# Patient Record
Sex: Male | Born: 1960 | Race: Black or African American | Hispanic: No | Marital: Married | Smoking: Light tobacco smoker
Health system: Southern US, Community
[De-identification: ages and names within clinical notes are randomized; demographics above are authoritative.]

## PROBLEM LIST (undated history)

## (undated) DIAGNOSIS — M109 Gout, unspecified: Secondary | ICD-10-CM

## (undated) DIAGNOSIS — I10 Essential (primary) hypertension: Secondary | ICD-10-CM

## (undated) DIAGNOSIS — N189 Chronic kidney disease, unspecified: Secondary | ICD-10-CM

## (undated) HISTORY — PX: HERNIA REPAIR: SHX51

---

## 2015-09-11 ENCOUNTER — Inpatient Hospital Stay
Admission: EM | Admit: 2015-09-11 | Discharge: 2015-09-17 | DRG: 690 | Disposition: A | Discharge status: Home / Self Care | Payer: BLUE CROSS/BLUE SHIELD | Attending: Internal Medicine | Admitting: Internal Medicine

## 2015-09-11 ENCOUNTER — Inpatient Hospital Stay: Payer: BLUE CROSS/BLUE SHIELD

## 2015-09-11 ENCOUNTER — Encounter: Payer: Self-pay | Admitting: Internal Medicine

## 2015-09-11 DIAGNOSIS — N2589 Other disorders resulting from impaired renal tubular function: Secondary | ICD-10-CM | POA: Diagnosis present

## 2015-09-11 DIAGNOSIS — Z87442 Personal history of urinary calculi: Secondary | ICD-10-CM | POA: Diagnosis not present

## 2015-09-11 DIAGNOSIS — N390 Urinary tract infection, site not specified: Secondary | ICD-10-CM | POA: Diagnosis present

## 2015-09-11 DIAGNOSIS — N5089 Other specified disorders of the male genital organs: Secondary | ICD-10-CM | POA: Diagnosis not present

## 2015-09-11 DIAGNOSIS — Z8249 Family history of ischemic heart disease and other diseases of the circulatory system: Secondary | ICD-10-CM

## 2015-09-11 DIAGNOSIS — N453 Epididymo-orchitis: Secondary | ICD-10-CM | POA: Diagnosis not present

## 2015-09-11 DIAGNOSIS — N50819 Testicular pain, unspecified: Secondary | ICD-10-CM | POA: Diagnosis not present

## 2015-09-11 DIAGNOSIS — E872 Acidosis: Secondary | ICD-10-CM | POA: Diagnosis present

## 2015-09-11 DIAGNOSIS — N184 Chronic kidney disease, stage 4 (severe): Secondary | ICD-10-CM | POA: Diagnosis present

## 2015-09-11 DIAGNOSIS — N136 Pyonephrosis: Secondary | ICD-10-CM | POA: Diagnosis present

## 2015-09-11 DIAGNOSIS — F17200 Nicotine dependence, unspecified, uncomplicated: Secondary | ICD-10-CM | POA: Diagnosis present

## 2015-09-11 DIAGNOSIS — I129 Hypertensive chronic kidney disease with stage 1 through stage 4 chronic kidney disease, or unspecified chronic kidney disease: Secondary | ICD-10-CM | POA: Diagnosis present

## 2015-09-11 DIAGNOSIS — N451 Epididymitis: Secondary | ICD-10-CM | POA: Diagnosis not present

## 2015-09-11 DIAGNOSIS — M109 Gout, unspecified: Secondary | ICD-10-CM | POA: Diagnosis present

## 2015-09-11 DIAGNOSIS — N1330 Unspecified hydronephrosis: Secondary | ICD-10-CM | POA: Diagnosis not present

## 2015-09-11 DIAGNOSIS — N289 Disorder of kidney and ureter, unspecified: Secondary | ICD-10-CM

## 2015-09-11 DIAGNOSIS — N19 Unspecified kidney failure: Secondary | ICD-10-CM

## 2015-09-11 DIAGNOSIS — N135 Crossing vessel and stricture of ureter without hydronephrosis: Secondary | ICD-10-CM | POA: Diagnosis present

## 2015-09-11 DIAGNOSIS — I10 Essential (primary) hypertension: Secondary | ICD-10-CM | POA: Diagnosis present

## 2015-09-11 DIAGNOSIS — N179 Acute kidney failure, unspecified: Secondary | ICD-10-CM | POA: Diagnosis present

## 2015-09-11 DIAGNOSIS — N5082 Scrotal pain: Secondary | ICD-10-CM

## 2015-09-11 DIAGNOSIS — E875 Hyperkalemia: Secondary | ICD-10-CM | POA: Diagnosis present

## 2015-09-11 HISTORY — DX: Gout, unspecified: M10.9

## 2015-09-11 HISTORY — DX: Chronic kidney disease, unspecified: N18.9

## 2015-09-11 HISTORY — DX: Essential (primary) hypertension: I10

## 2015-09-11 LAB — CBC
HEMATOCRIT: 33.1 % — AB (ref 40.0–52.0)
HEMOGLOBIN: 11.4 g/dL — AB (ref 13.0–18.0)
MCH: 32.5 pg (ref 26.0–34.0)
MCHC: 34.4 g/dL (ref 32.0–36.0)
MCV: 94.4 fL (ref 80.0–100.0)
Platelets: 167 10*3/uL (ref 150–440)
RBC: 3.5 MIL/uL — ABNORMAL LOW (ref 4.40–5.90)
RDW: 12.7 % (ref 11.5–14.5)
WBC: 10.2 10*3/uL (ref 3.8–10.6)

## 2015-09-11 LAB — LIPASE, BLOOD: LIPASE: 57 U/L — AB (ref 11–51)

## 2015-09-11 LAB — BASIC METABOLIC PANEL
Anion gap: 13 (ref 5–15)
BUN: 97 mg/dL — AB (ref 6–20)
CHLORIDE: 104 mmol/L (ref 101–111)
CO2: 16 mmol/L — ABNORMAL LOW (ref 22–32)
Calcium: 9.4 mg/dL (ref 8.9–10.3)
Creatinine, Ser: 6.33 mg/dL — ABNORMAL HIGH (ref 0.61–1.24)
GFR calc Af Amer: 10 mL/min — ABNORMAL LOW (ref >60)
GFR calc non Af Amer: 9 mL/min — ABNORMAL LOW (ref >60)
GLUCOSE: 150 mg/dL — AB (ref 65–99)
POTASSIUM: 5.2 mmol/L — AB (ref 3.5–5.1)
Sodium: 133 mmol/L — ABNORMAL LOW (ref 135–145)

## 2015-09-11 LAB — COMPREHENSIVE METABOLIC PANEL
ALBUMIN: 2.8 g/dL — AB (ref 3.5–5.0)
ALK PHOS: 81 U/L (ref 38–126)
ALT: 38 U/L (ref 17–63)
ANION GAP: 14 (ref 5–15)
AST: 27 U/L (ref 15–41)
BILIRUBIN TOTAL: 1.3 mg/dL — AB (ref 0.3–1.2)
BUN: 104 mg/dL — ABNORMAL HIGH (ref 6–20)
CALCIUM: 9.6 mg/dL (ref 8.9–10.3)
CO2: 14 mmol/L — ABNORMAL LOW (ref 22–32)
Chloride: 104 mmol/L (ref 101–111)
Creatinine, Ser: 6.9 mg/dL — ABNORMAL HIGH (ref 0.61–1.24)
GFR calc Af Amer: 9 mL/min — ABNORMAL LOW (ref >60)
GFR, EST NON AFRICAN AMERICAN: 8 mL/min — AB (ref >60)
GLUCOSE: 100 mg/dL — AB (ref 65–99)
Potassium: 5.5 mmol/L — ABNORMAL HIGH (ref 3.5–5.1)
Sodium: 132 mmol/L — ABNORMAL LOW (ref 135–145)
TOTAL PROTEIN: 7.3 g/dL (ref 6.5–8.1)

## 2015-09-11 LAB — URINALYSIS COMPLETE WITH MICROSCOPIC (ARMC ONLY)
Bilirubin Urine: NEGATIVE
Glucose, UA: NEGATIVE mg/dL
Ketones, ur: NEGATIVE mg/dL
Nitrite: NEGATIVE
PH: 5 (ref 5.0–8.0)
PROTEIN: 30 mg/dL — AB
Specific Gravity, Urine: 1.01 (ref 1.005–1.030)

## 2015-09-11 LAB — SODIUM, URINE, RANDOM: Sodium, Ur: 26 mmol/L

## 2015-09-11 LAB — TROPONIN I: Troponin I: <0.03 ng/mL (ref <0.03)

## 2015-09-11 LAB — CREATININE, URINE, RANDOM: Creatinine, Urine: 143 mg/dL

## 2015-09-11 LAB — URIC ACID: URIC ACID, SERUM: 15.1 mg/dL — AB (ref 4.4–7.6)

## 2015-09-11 MED ORDER — DOCUSATE SODIUM 100 MG PO CAPS
100.0000 mg | ORAL_CAPSULE | Freq: Two times a day (BID) | ORAL | Status: DC
  Administered 2015-09-11 – 2015-09-14 (×5): 100 mg via ORAL
  Filled 2015-09-11 (×6): qty 1

## 2015-09-11 MED ORDER — HYDRALAZINE HCL 25 MG PO TABS
25.0000 mg | ORAL_TABLET | Freq: Three times a day (TID) | ORAL | Status: DC
  Administered 2015-09-11: 25 mg via ORAL
  Filled 2015-09-11 (×2): qty 1

## 2015-09-11 MED ORDER — ACETAMINOPHEN 650 MG RE SUPP: 650.0000 mg | Freq: Four times a day (QID) | RECTAL | Status: DC | PRN

## 2015-09-11 MED ORDER — SODIUM CHLORIDE 0.9 % IV SOLN
INTRAVENOUS | Status: DC
  Administered 2015-09-11 – 2015-09-15 (×11): via INTRAVENOUS

## 2015-09-11 MED ORDER — METOPROLOL TARTRATE 50 MG PO TABS
50.0000 mg | ORAL_TABLET | Freq: Two times a day (BID) | ORAL | Status: DC
  Administered 2015-09-11 – 2015-09-17 (×12): 50 mg via ORAL
  Filled 2015-09-11 (×12): qty 1

## 2015-09-11 MED ORDER — SODIUM POLYSTYRENE SULFONATE 15 GM/60ML PO SUSP
30.0000 g | Freq: Once | ORAL | Status: AC
  Administered 2015-09-11: 30 g via ORAL
  Filled 2015-09-11: qty 120

## 2015-09-11 MED ORDER — HEPARIN SODIUM (PORCINE) 5000 UNIT/ML IJ SOLN
5000.0000 [IU] | Freq: Three times a day (TID) | INTRAMUSCULAR | Status: DC
  Administered 2015-09-11 – 2015-09-17 (×16): 5000 [IU] via SUBCUTANEOUS
  Filled 2015-09-11 (×16): qty 1

## 2015-09-11 MED ORDER — ACETAMINOPHEN 325 MG PO TABS: 650.0000 mg | ORAL_TABLET | Freq: Four times a day (QID) | ORAL | Status: DC | PRN

## 2015-09-11 MED ORDER — ONDANSETRON HCL 4 MG/2ML IJ SOLN
4.0000 mg | Freq: Four times a day (QID) | INTRAMUSCULAR | Status: DC | PRN
  Administered 2015-09-11: 4 mg via INTRAVENOUS
  Filled 2015-09-11: qty 2

## 2015-09-11 MED ORDER — ONDANSETRON HCL 4 MG/2ML IJ SOLN
4.0000 mg | Freq: Once | INTRAMUSCULAR | Status: AC
  Administered 2015-09-11: 4 mg via INTRAVENOUS
  Filled 2015-09-11: qty 2

## 2015-09-11 MED ORDER — DEXTROSE 5 % IV SOLN
1.0000 g | Freq: Once | INTRAVENOUS | Status: AC
  Administered 2015-09-11: 1 g via INTRAVENOUS
  Filled 2015-09-11: qty 10

## 2015-09-11 MED ORDER — ONDANSETRON HCL 4 MG PO TABS: 4.0000 mg | ORAL_TABLET | Freq: Four times a day (QID) | ORAL | Status: DC | PRN

## 2015-09-11 MED ORDER — MORPHINE SULFATE (PF) 4 MG/ML IV SOLN
4.0000 mg | Freq: Once | INTRAVENOUS | Status: AC
  Administered 2015-09-11: 4 mg via INTRAVENOUS
  Filled 2015-09-11: qty 1

## 2015-09-11 MED ORDER — DEXTROSE 5 % IV SOLN
1.0000 g | INTRAVENOUS | Status: DC
  Administered 2015-09-12 – 2015-09-14 (×3): 1 g via INTRAVENOUS
  Filled 2015-09-11 (×3): qty 10

## 2015-09-11 MED ORDER — OXYCODONE HCL 5 MG PO TABS
5.0000 mg | ORAL_TABLET | ORAL | Status: DC | PRN
  Administered 2015-09-11 – 2015-09-17 (×10): 5 mg via ORAL
  Filled 2015-09-11 (×12): qty 1

## 2015-09-11 MED ORDER — MORPHINE SULFATE (PF) 2 MG/ML IV SOLN
2.0000 mg | INTRAVENOUS | Status: DC | PRN
  Administered 2015-09-11 – 2015-09-16 (×9): 2 mg via INTRAVENOUS
  Filled 2015-09-11 (×9): qty 1

## 2015-09-11 MED ORDER — SODIUM CHLORIDE 0.9% FLUSH
3.0000 mL | Freq: Two times a day (BID) | INTRAVENOUS | Status: DC
  Administered 2015-09-11 – 2015-09-16 (×9): 3 mL via INTRAVENOUS

## 2015-09-11 NOTE — ED Provider Notes (Signed)
[facility] Emergency Department Provider Note  Time seen: 9:33 AM  I have reviewed the triage vital signs and the nursing notes.   HISTORY  Chief Complaint Weakness    HPI Shiquan Mathieu is a 55 y.o. male with a past medical history of gout who presents to the emergency department multiple complaints including knee pain, shoulder pain, abdominal pain and kidney dysfunction. According to the patient he has chronic kidney disease, feels like he has a "kidney infection." Patient denies any dysuria or fever. Does state some nausea but denies vomiting or diarrhea. States he's having some back pain. He states his main complaint however is pain in both of his knees and shoulders which she states is consistent with gout. He states the reason he came into the emergency department today was because he could not sleep last night due to his joint pain.Patient also states diffuse generalized weakness. Denies chest pain.     No past medical history on file.  There are no active problems to display for this patient.   No past surgical history on file.  No current outpatient prescriptions on file.  Allergies Review of patient's allergies indicates not on file.  No family history on file.  Social History Social History  Substance Use Topics  . Smoking status: Not on file  . Smokeless tobacco: Not on file  . Alcohol Use: Not on file    Review of Systems Constitutional: Negative for fever.Positive for generalized weakness. Cardiovascular: Negative for chest pain. Respiratory: Negative for shortness of breath. Gastrointestinal: Mild diffuse abdominal pain. Positive for nausea. Negative for vomiting or diarrhea. Genitourinary: Negative for dysuria. Positive for back pain consistent with "kidney pain" Musculoskeletal: Moderate mid to lower back pain Neurological: Negative for headache 10-point ROS otherwise  negative.  ____________________________________________   PHYSICAL EXAM:  Constitutional: Alert and oriented. Well appearing and in no distress. Eyes: Normal exam ENT   Head: Normocephalic and atraumatic.   Mouth/Throat: Mucous membranes are moist. Cardiovascular: Normal rate, regular rhythm. No murmur Respiratory: Normal respiratory effort without tachypnea nor retractions. Breath sounds are clear  Gastrointestinal: Soft, mild diffuse abdominal tenderness palpation, no rebound or guarding, no distention. Musculoskeletal: Nontender with normal range of motion in all extremities. Neurologic:  Normal speech and language. No gross focal neurologic deficits Skin:  Skin is warm, dry and intact.  Psychiatric: Mood and affect are normal.  ____________________________________________    EKG  EKG reviewed and interpreted by myself shows sinus rhythm at 98 bpm narrow QRS, normal axis, largely normal intervals, nonspecific ST changes without ST elevation.  ____________________________________________   INITIAL IMPRESSION / ASSESSMENT AND PLAN / ED COURSE  Pertinent labs & imaging results that were available during my care of the patient were reviewed by me and considered in my medical decision making (see chart for details).  The patient presents the emergency department with multiple complaints including generalized weakness, kidney pain, possible kidney infection, and joint pain, gout pain. Patient's biggest complaint he states is the pain in his knees and shoulders. We will check labs, and closely monitor in the emergency department. Patient's initial blood pressure is low around 100 systolic with a heart rate in the mid 90s. We will IV hydrate while awaiting lab results.  Urinalysis consistent with urinary tract infection. Creatinine 6.9 consistent with acute renal insufficiency. We will admit the patient for further  treatment. ____________________________________________   FINAL CLINICAL IMPRESSION(S) / ED DIAGNOSES  Generalized weakness Joint pains Abdominal pain Urinary tract infection Acute renal  insufficiency  Minna AntisKevin Tyneka Scafidi, MD 09/11/15 1224

## 2015-09-11 NOTE — H&P (Signed)
[facility] - [place] at [facility]   PATIENT NAME: Frederick Lee    MR#:  [id number]  DATE OF BIRTH:  11/19/1960   DATE OF ADMISSION:  09/11/2015  PRIMARY CARE PHYSICIAN: Pcp Not In System   REQUESTING/REFERRING PHYSICIAN: Paduchowski  CHIEF COMPLAINT:   Chief Complaint  Patient presents with  . Weakness    HISTORY OF PRESENT ILLNESS:  Frederick Lee  is a 55 y.o. male with a known history of Essential hypertension, chronic kidney disease unknown baseline possibly stage IV with  GFR of 21, gout who is presenting with generalized weakness and abdominal pain. He describes 3-4 day duration of abdominal pain suprapubic in location and painful in quality 7/10 in intensity no worsening or relieving factors. He also describes dysuria, subjective fevers chills, poor oral intake. 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  PAST MEDICAL HISTORY:   Past Medical History  Diagnosis Date  . Hypertension   . Gout   . Chronic kidney disease     PAST SURGICAL HISTORY:   Past Surgical History  Procedure Laterality Date  . Hernia repair      SOCIAL HISTORY:   Social History  Substance Use Topics  . Smoking status: Light Tobacco Smoker  . Smokeless tobacco: Not on file  . Alcohol Use: No    FAMILY HISTORY:   Family History  Problem Relation Age of Onset  . CAD Other     DRUG ALLERGIES:  No Known Allergies  REVIEW OF SYSTEMS:  REVIEW OF SYSTEMS:  CONSTITUTIONAL: Positive fevers, chills, fatigue, weakness.  EYES: Denies blurred vision, double vision, or eye pain.  EARS, NOSE, THROAT: Denies tinnitus, ear pain, hearing loss.  RESPIRATORY: denies cough, shortness of breath, wheezing  CARDIOVASCULAR: Denies chest pain, palpitations, edema.  GASTROINTESTINAL: Denies nausea, vomiting, diarrhea, positive abdominal pain.  GENITOURINARY: Positive dysuria, denies hematuria.    ENDOCRINE: Denies nocturia or thyroid problems. HEMATOLOGIC AND LYMPHATIC: Denies easy bruising or bleeding.  SKIN: Denies rash or lesions.  MUSCULOSKELETAL: Denies pain in neck, back, shoulder, knees, hips, or further arthritic symptoms.  NEUROLOGIC: Denies paralysis, paresthesias.  PSYCHIATRIC: Denies anxiety or depressive symptoms. Otherwise full review of systems performed by me is negative.   MEDICATIONS AT HOME:   Prior to Admission medications   Not on File      VITAL SIGNS:  Blood pressure 139/72, pulse 98, temperature 97.5 F (36.4 C), temperature source Oral, resp. rate 18, height  (1.981 m), weight 320 lb (145.151 kg), SpO2 99 %.  PHYSICAL EXAMINATION:  VITAL SIGNS: Filed Vitals:   09/11/15 1150 09/11/15 1217  BP: 133/69 139/72  Pulse:  98  Temp:    Resp: 20 18   GENERAL:54 y.o.male currently in Minimal acute distress. Ill appearing HEAD: Normocephalic, atraumatic.  EYES: Pupils equal, round, reactive to light. Extraocular muscles intact. No scleral icterus.  MOUTH: Moist mucosal membrane. Dentition intact. No abscess noted.  EAR, NOSE, THROAT: Clear without exudates. No external lesions.  NECK: Supple. No thyromegaly. No nodules. No JVD.  PULMONARY: Clear to ascultation, without wheeze rails or rhonci. No use of accessory muscles, Good respiratory effort. good air entry bilaterally CHEST: Nontender to palpation.  CARDIOVASCULAR: S1 and S2. Regular rate and rhythm. No murmurs, rubs, or gallops. No edema. Pedal pulses 2+ bilaterally.  GASTROINTESTINAL: Soft, suprapubic tenderness without rebound or guarding, nondistended. No masses. Positive bowel sounds. No hepatosplenomegaly.  MUSCULOSKELETAL: No swelling,  clubbing, or edema. Range of motion full in all extremities.  NEUROLOGIC: Cranial nerves II through XII are intact. No gross focal neurological deficits. Sensation intact. Reflexes intact.  SKIN: No ulceration, lesions, rashes, or cyanosis. Skin warm and  dry. Turgor intact.  PSYCHIATRIC: Mood, affect within normal limits. The patient is awake, alert and oriented x 3. Insight, judgment intact.    LABORATORY PANEL:   CBC  Recent Labs Lab 09/11/15 0937  WBC 10.2  HGB 11.4*  HCT 33.1*  PLT 167   ------------------------------------------------------------------------------------------------------------------  Chemistries   Recent Labs Lab 09/11/15 0937  NA 132*  K 5.5*  CL 104  CO2 14*  GLUCOSE 100*  BUN 104*  CREATININE 6.90*  CALCIUM 9.6  AST 27  ALT 38  ALKPHOS 81  BILITOT 1.3*   ------------------------------------------------------------------------------------------------------------------  Cardiac Enzymes  Recent Labs Lab 09/11/15 0937  TROPONINI <0.03   ------------------------------------------------------------------------------------------------------------------  RADIOLOGY:  No results found.  EKG:  No orders found for this or any previous visit.  IMPRESSION AND PLAN:   55 year old gentleman history of essential hypertension, CK D possibly stage IV presenting with abdominal pain and weakness  1. Acute renal failure: Uncertain baseline possibly stage IV, IV fluid hydration follow urine output and renal function check urine lites, renal ultrasound 2. Hyperkalemia: IV fluid hydration, Kayexalate, recheck BMP to follow potassium level III. Urinary tract infection site unspecified: Ceftriaxone, follow urine culture blood culture 4. Essential hypertension: Patient is somewhat uncertain of medications though states he thinks he is on metoprolol, lisinopril, hydrochlorothiazide plus one other medication regardless I would hold lisinopril hydrochlorothiazide for now if blood pressure elevates can restart low-dose metoprolol until figure out what his medications actually are, he states he has not taken them for at least 4 days anyways 5. Venous thromboembolism prophylactic: Heparin    All the records are  reviewed and case discussed with ED provider. Management plans discussed with the patient, family and they are in agreement.  CODE STATUS: Full  TOTAL TIME TAKING CARE OF THIS PATIENT: Critical 33 minutes.    Hower,  Mardi MainlandDavid K M.D on 09/11/2015 at 12:44 PM  Between 7am to 6pm - Pager - [phone]  After 6pm: House Pager: - [phone]  [facility] [place] [facility]  Office  [phone]  CC: Primary care physician; Pcp Not In System

## 2015-09-11 NOTE — ED Notes (Signed)
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

## 2015-09-11 NOTE — ED Notes (Signed)
Pt states that he has never felt this bad in his life,pt reports pain all over, mostly his gout pain in his legs, but states that he feels weak as water. Pt also states that about a week ago he started feeling like he maybe getting a uti because he was having side pain, pt states that he hasn't eaten since Tuesday and states he hasn't drank anything since Wednesday, pt states that he hasn't called his family, so this RN and charge RN assisted pt to do so on his cell phone

## 2015-09-12 ENCOUNTER — Encounter
Admission: EM | Disposition: A | Discharge status: Home / Self Care | Payer: Self-pay | Source: Home / Self Care | Attending: Internal Medicine

## 2015-09-12 ENCOUNTER — Inpatient Hospital Stay: Payer: BLUE CROSS/BLUE SHIELD

## 2015-09-12 ENCOUNTER — Inpatient Hospital Stay: Payer: BLUE CROSS/BLUE SHIELD | Admitting: Anesthesiology

## 2015-09-12 ENCOUNTER — Encounter: Payer: Self-pay | Admitting: Anesthesiology

## 2015-09-12 DIAGNOSIS — N179 Acute kidney failure, unspecified: Secondary | ICD-10-CM | POA: Diagnosis not present

## 2015-09-12 DIAGNOSIS — N1330 Unspecified hydronephrosis: Secondary | ICD-10-CM

## 2015-09-12 DIAGNOSIS — N5082 Scrotal pain: Secondary | ICD-10-CM

## 2015-09-12 DIAGNOSIS — N390 Urinary tract infection, site not specified: Secondary | ICD-10-CM

## 2015-09-12 DIAGNOSIS — N19 Unspecified kidney failure: Secondary | ICD-10-CM

## 2015-09-12 DIAGNOSIS — N289 Disorder of kidney and ureter, unspecified: Secondary | ICD-10-CM

## 2015-09-12 HISTORY — PX: CYSTOSCOPY WITH STENT PLACEMENT: SHX5790

## 2015-09-12 LAB — CBC
HEMATOCRIT: 28.8 % — AB (ref 40.0–52.0)
Hemoglobin: 9.9 g/dL — ABNORMAL LOW (ref 13.0–18.0)
MCH: 32.5 pg (ref 26.0–34.0)
MCHC: 34.4 g/dL (ref 32.0–36.0)
MCV: 94.5 fL (ref 80.0–100.0)
Platelets: 154 10*3/uL (ref 150–440)
RBC: 3.05 MIL/uL — AB (ref 4.40–5.90)
RDW: 13 % (ref 11.5–14.5)
WBC: 9 10*3/uL (ref 3.8–10.6)

## 2015-09-12 LAB — BASIC METABOLIC PANEL
Anion gap: 9 (ref 5–15)
BUN: 99 mg/dL — AB (ref 6–20)
CHLORIDE: 110 mmol/L (ref 101–111)
CO2: 18 mmol/L — AB (ref 22–32)
CREATININE: 5.98 mg/dL — AB (ref 0.61–1.24)
Calcium: 8.7 mg/dL — ABNORMAL LOW (ref 8.9–10.3)
GFR calc non Af Amer: 10 mL/min — ABNORMAL LOW (ref >60)
GFR, EST AFRICAN AMERICAN: 11 mL/min — AB (ref >60)
Glucose, Bld: 104 mg/dL — ABNORMAL HIGH (ref 65–99)
POTASSIUM: 5.4 mmol/L — AB (ref 3.5–5.1)
Sodium: 137 mmol/L (ref 135–145)

## 2015-09-12 LAB — URINE CULTURE

## 2015-09-12 LAB — MRSA PCR SCREENING: MRSA by PCR: NEGATIVE

## 2015-09-12 SURGERY — CYSTOSCOPY, WITH STENT INSERTION
Anesthesia: General | Laterality: Left

## 2015-09-12 MED ORDER — FENTANYL CITRATE (PF) 100 MCG/2ML IJ SOLN
INTRAMUSCULAR | Status: DC | PRN
  Administered 2015-09-12: 25 ug via INTRAVENOUS
  Administered 2015-09-12: 50 ug via INTRAVENOUS
  Administered 2015-09-12 (×3): 25 ug via INTRAVENOUS

## 2015-09-12 MED ORDER — MIDAZOLAM HCL 2 MG/2ML IJ SOLN
INTRAMUSCULAR | Status: DC | PRN
  Administered 2015-09-12: 2 mg via INTRAVENOUS
  Administered 2015-09-12: 3 mg via INTRAVENOUS

## 2015-09-12 MED ORDER — PROPOFOL 10 MG/ML IV BOLUS
INTRAVENOUS | Status: DC | PRN
  Administered 2015-09-12 (×2): 50 mg via INTRAVENOUS

## 2015-09-12 MED ORDER — FENTANYL CITRATE (PF) 100 MCG/2ML IJ SOLN
25.0000 ug | INTRAMUSCULAR | Status: DC | PRN
Start: 2015-09-12 — End: 2015-09-12

## 2015-09-12 MED ORDER — PREDNISONE 20 MG PO TABS
40.0000 mg | ORAL_TABLET | Freq: Every day | ORAL | Status: DC
  Administered 2015-09-12 – 2015-09-15 (×4): 40 mg via ORAL
  Filled 2015-09-12 (×4): qty 2

## 2015-09-12 MED ORDER — PROPOFOL 500 MG/50ML IV EMUL
INTRAVENOUS | Status: DC | PRN
  Administered 2015-09-12: 100 ug/kg/min via INTRAVENOUS

## 2015-09-12 MED ORDER — ONDANSETRON HCL 4 MG/2ML IJ SOLN
INTRAMUSCULAR | Status: DC | PRN
Start: 2015-09-12 — End: 2015-09-12
  Administered 2015-09-12: 4 mg via INTRAVENOUS

## 2015-09-12 MED ORDER — IOTHALAMATE MEGLUMINE 43 % IV SOLN
INTRAVENOUS | Status: DC | PRN
  Administered 2015-09-12: 10 mL via URETHRAL

## 2015-09-12 MED ORDER — PROMETHAZINE HCL 25 MG/ML IJ SOLN: 6.2500 mg | INTRAMUSCULAR | Status: DC | PRN

## 2015-09-12 SURGICAL SUPPLY — 23 items
BAG DRAIN CYSTO-URO LG1000N (MISCELLANEOUS) ×2 IMPLANT
CATH FOL 2WAY LX 16X5 (CATHETERS) IMPLANT
CATH FOLEY 2WAY  5CC 16FR (CATHETERS) ×1
CATH URETL 5X70 OPEN END (CATHETERS) ×2 IMPLANT
CATH URTH 16FR FL 2W BLN LF (CATHETERS) ×1 IMPLANT
CONRAY 43 FOR UROLOGY 50M (MISCELLANEOUS) ×2 IMPLANT
GLOVE BIO SURGEON STRL SZ 6.5 (GLOVE) ×6 IMPLANT
GOWN STRL REUS W/ TWL LRG LVL4 (GOWN DISPOSABLE) ×2 IMPLANT
GOWN STRL REUS W/TWL LRG LVL4 (GOWN DISPOSABLE) ×2
HOLDER FOLEY CATH W/STRAP (MISCELLANEOUS) ×2 IMPLANT
KIT RM TURNOVER CYSTO AR (KITS) ×2 IMPLANT
PACK CYSTO AR (MISCELLANEOUS) ×2 IMPLANT
SENSORWIRE 0.038 NOT ANGLED (WIRE) ×2
SET CYSTO W/LG BORE CLAMP LF (SET/KITS/TRAYS/PACK) ×2 IMPLANT
SOL .9 NS 3000ML IRR  AL (IV SOLUTION) ×1
SOL .9 NS 3000ML IRR UROMATIC (IV SOLUTION) ×1 IMPLANT
STENT URET 6FRX24 CONTOUR (STENTS) IMPLANT
STENT URET 6FRX26 CONTOUR (STENTS) IMPLANT
STENT URO INLAY 6FRX26CM (STENTS) ×2 IMPLANT
SURGILUBE 2OZ TUBE FLIPTOP (MISCELLANEOUS) ×2 IMPLANT
SYRINGE IRR TOOMEY STRL 70CC (SYRINGE) ×2 IMPLANT
WATER STERILE IRR 1000ML POUR (IV SOLUTION) ×2 IMPLANT
WIRE SENSOR 0.038 NOT ANGLED (WIRE) ×1 IMPLANT

## 2015-09-12 NOTE — Progress Notes (Signed)
[facility] - [place] at [facility]   PATIENT NAME: Frederick Lee    MRN#:  [id number]  DATE OF BIRTH:  08/18/1960  SUBJECTIVE:  Hospital Day: 1 day Frederick Lee is a 55 y.o. male presenting with Weakness .   Overnight events: No acute overnight events Interval Events: New complaints of bilateral knee pain right hand pain patient states "my gout is acting up"  REVIEW OF SYSTEMS:  CONSTITUTIONAL: No fever, fatigue or weakness.  EYES: No blurred or double vision.  EARS, NOSE, AND THROAT: No tinnitus or ear pain.  RESPIRATORY: No cough, shortness of breath, wheezing or hemoptysis.  CARDIOVASCULAR: No chest pain, orthopnea, edema.  GASTROINTESTINAL: No nausea, vomiting, diarrhea or abdominal pain.  GENITOURINARY: No dysuria, hematuria.  ENDOCRINE: No polyuria, nocturia,  HEMATOLOGY: No anemia, easy bruising or bleeding SKIN: No rash or lesion. MUSCULOSKELETAL: Positive joint pain as above.   NEUROLOGIC: No tingling, numbness, weakness.  PSYCHIATRY: No anxiety or depression.   DRUG ALLERGIES:  No Known Allergies  VITALS:  Blood pressure 110/55, pulse 78, temperature 97.8 F (36.6 C), temperature source Oral, resp. rate 20, height  (1.981 m), weight 320 lb (145.151 kg), SpO2 98 %.  PHYSICAL EXAMINATION:  VITAL SIGNS: Filed Vitals:   09/12/15 0515 09/12/15 0942  BP: 116/57 110/55  Pulse: 78   Temp: 97.8 F (36.6 C)   Resp: 20    GENERAL:54 y.o.male currently in no acute distress.  HEAD: Normocephalic, atraumatic.  EYES: Pupils equal, round, reactive to light. Extraocular muscles intact. No scleral icterus.  MOUTH: Moist mucosal membrane. Dentition intact. No abscess noted.  EAR, NOSE, THROAT: Clear without exudates. No external lesions.  NECK: Supple. No thyromegaly. No nodules. No JVD.  PULMONARY: Clear to ascultation, without wheeze rails or rhonci. No use of accessory muscles, Good respiratory effort. good air entry bilaterally CHEST:  Nontender to palpation.  CARDIOVASCULAR: S1 and S2. Regular rate and rhythm. No murmurs, rubs, or gallops. No edema. Pedal pulses 2+ bilaterally.  GASTROINTESTINAL: Soft, nontender, nondistended. No masses. Positive bowel sounds. No hepatosplenomegaly.  MUSCULOSKELETAL: Bilateral knees and right 4-5 MCP with erythema and edema and to touch otherwise No swelling, clubbing, or edema. Range of motion full in all extremities.  NEUROLOGIC: Cranial nerves II through XII are intact. No gross focal neurological deficits. Sensation intact. Reflexes intact.  SKIN: No ulceration, lesions, rashes, or cyanosis. Skin warm and dry. Turgor intact.  PSYCHIATRIC: Mood, affect within normal limits. The patient is awake, alert and oriented x 3. Insight, judgment intact.      LABORATORY PANEL:   CBC  Recent Labs Lab 09/12/15 0346  WBC 9.0  HGB 9.9*  HCT 28.8*  PLT 154   ------------------------------------------------------------------------------------------------------------------  Chemistries   Recent Labs Lab 09/11/15 0937  09/12/15 0346  NA 132*  < > 137  K 5.5*  < > 5.4*  CL 104  < > 110  CO2 14*  < > 18*  GLUCOSE 100*  < > 104*  BUN 104*  < > 99*  CREATININE 6.90*  < > 5.98*  CALCIUM 9.6  < > 8.7*  AST 27  --   --   ALT 38  --   --   ALKPHOS 81  --   --   BILITOT 1.3*  --   --   < > = values in this interval not displayed. ------------------------------------------------------------------------------------------------------------------  Cardiac Enzymes  Recent Labs Lab 09/11/15 0937  TROPONINI <0.03   ------------------------------------------------------------------------------------------------------------------  RADIOLOGY:  US Renal  09/11/2015  CLINICAL DATA:  Acute renal injury EXAM: RENAL / URINARY TRACT ULTRASOUND COMPLETE COMPARISON:  None in PACs FINDINGS: Right Kidney: Length: 11.5 cm. The renal cortical echotexture is increased diffusely. There is no  hydronephrosis. There is no discrete mass. Left Kidney: Length: 14.3 cm. There is severe hydronephrosis. There is cortical thinning. The echotexture of the thinned cortex is increased similar to that on the right. Bladder: The urinary bladder is only partially distended. Ureteral jets are not observed. IMPRESSION: 1. Severe left-sided hydronephrosis. 2. Moderately increased renal cortical echotexture bilaterally. Significant cortical thinning on the left. 3. Only partial distention of the urinary bladder. Electronically Signed   By: David  SwazilandJordan M.D.   On: 09/11/2015 16:56    EKG:  No orders found for this or any previous visit.  ASSESSMENT AND PLAN:   Frederick Lee is a 55 y.o. male presenting with Weakness . Admitted 09/11/2015 : Day #: 1 day 1. Acute kidney injury on chronic kidney disease: Continue IV fluid hydration will consult nephrology given slow return to baseline follow urine output renal function 2. Hyperkalemia: Continue IV fluid hydration continue to monitor if elevates will need further intervention 3. Acute gout flare: Treatment options somewhat limited given kidney function will have pain medications and steroids 4. Urinary tract infection: Continue IV antibiotics follow culture data and adjust accordingly 5. Essential hypertension: Blood pressure has been on the lower side we'll discontinue hydralazine and continue with metoprolol 6. Venous thromboembolism prophylactic: Heparin   All the records are reviewed and case discussed with Care Management/Social Workerr. Management plans discussed with the patient, family and they are in agreement.  CODE STATUS: full TOTAL TIME TAKING CARE OF THIS PATIENT: 33 minutes.   POSSIBLE D/C IN 2-3DAYS, DEPENDING ON CLINICAL CONDITION.   Hower,  Mardi MainlandDavid K M.D on 09/12/2015 at 10:35 AM  Between 7am to 6pm - Pager - [phone]  After 6pm: House Pager: - [phone]  [facility]  Office  [phone]  CC: Primary care  physician; Pcp Not In System

## 2015-09-12 NOTE — Transfer of Care (Signed)
Immediate Anesthesia Transfer of Care Note  Patient: Frederick Lee  Procedure(s) Performed: Procedure(s): CYSTOSCOPY WITH STENT PLACEMENT (Left)  Patient Location: PACU  Anesthesia Type:General  Level of Consciousness: awake, alert  and oriented  Airway & Oxygen Therapy: Patient Spontanous Breathing and Patient connected to face mask oxygen  Post-op Assessment: Report given to RN and Post -op Vital signs reviewed and stable  Post vital signs: Reviewed and stable  Last Vitals:  Filed Vitals:   09/12/15 0942 09/12/15 1500  BP: 110/55 112/56  Pulse:  87  Temp:  36.6 C  Resp:  19    Last Pain:  Filed Vitals:   09/12/15 1529  PainSc: Asleep      Patients Stated Pain Goal: 0 (09/12/15 0955)  Complications: No apparent anesthesia complications

## 2015-09-12 NOTE — Consult Note (Signed)
[facility]  CONSULT NOTE    Date: 09/12/2015                  Patient Name:  Frederick Lee  MRN: [id number]  DOB: 08/17/1960  Age / Sex: 55 y.o., male         PCP: Pcp Not In System                 Service Requesting Consult: Dr. Clint GuyHower                 Reason for Consult: Acute renal failure with chronic kidney disease stage IV            History of Present Illness: Frederick Lee is a 55 y.o. Native American male with hypertension, gout, obesity, who was admitted to [facility] on 09/11/2015 for UTI (lower urinary tract infection) [N39.0] Acute renal insufficiency [N28.9]  [place], [place]. He is following with a nephrologist locally for chronic kidney disease stage IV with baseline GFR of 21.   Patient was driving when he started having fevers/chills, abdominal pain and dysuria. He stopped at [facility] for further care.   Patient is now in an acute exacerbation of gout. Started on prednisone.    Medications: Outpatient medications: Prescriptions prior to admission  Medication Sig Dispense Refill Last Dose  . docusate sodium (COLACE) 100 MG capsule Take 100 mg by mouth 2 (two) times daily.   09/10/2015 at Unknown time  . hydrALAZINE (APRESOLINE) 25 MG tablet Take 25 mg by mouth 3 (three) times daily.   09/10/2015 at Unknown time  . lisinopril (PRINIVIL,ZESTRIL) 20 MG tablet Take 20 mg by mouth daily.   09/10/2015 at Unknown time  . metoprolol (LOPRESSOR) 50 MG tablet Take 50 mg by mouth 2 (two) times daily.   09/10/2015 at Unknown time    Current medications: Current Facility-Administered Medications  Medication Dose Route Frequency Provider Last Rate Last Dose  . 0.9 %  sodium chloride infusion   Intravenous Continuous Wyatt Hasteavid K Hower, MD 125 mL/hr at 09/12/15 [phone]    . acetaminophen (TYLENOL) tablet 650 mg  650 mg Oral Q6H PRN Wyatt Hasteavid K Hower, MD       Or  . acetaminophen (TYLENOL) suppository 650 mg  650 mg Rectal Q6H PRN Wyatt Hasteavid K  Hower, MD      . cefTRIAXone (ROCEPHIN) 1 g in dextrose 5 % 50 mL IVPB  1 g Intravenous Q24H Wyatt Hasteavid K Hower, MD   1 g at 09/12/15 0950  . docusate sodium (COLACE) capsule 100 mg  100 mg Oral BID Wyatt Hasteavid K Hower, MD   100 mg at 09/11/15 2219  . heparin injection 5,000 Units  5,000 Units Subcutaneous Q8H Wyatt Hasteavid K Hower, MD   5,000 Units at 09/12/15 [id number]  . metoprolol (LOPRESSOR) tablet 50 mg  50 mg Oral BID Wyatt Hasteavid K Hower, MD   50 mg at 09/12/15 0948  . morphine 2 MG/ML injection 2 mg  2 mg Intravenous Q4H PRN Wyatt Hasteavid K Hower, MD   2 mg at 09/12/15 0030  . ondansetron (ZOFRAN) tablet 4 mg  4 mg Oral Q6H PRN Wyatt Hasteavid K Hower, MD       Or  . ondansetron [facility](ZOFRAN) injection 4 mg  4 mg Intravenous Q6H PRN Wyatt Hasteavid K Hower, MD   4 mg at 09/11/15 1302  . oxyCODONE (Oxy IR/ROXICODONE) immediate release tablet 5 mg  5 mg Oral Q4H PRN Wyatt Hasteavid K Hower, MD  5 mg at 09/12/15 0955  . predniSONE (DELTASONE) tablet 40 mg  40 mg Oral Q breakfast Wyatt Haste, MD   40 mg at 09/12/15 0948  . sodium chloride flush (NS) 0.9 % injection 3 mL  3 mL Intravenous Q12H Wyatt Haste, MD   3 mL at 09/11/15 1720      Allergies: No Known Allergies    Past Medical History: Past Medical History  Diagnosis Date  . Hypertension   . Gout   . Chronic kidney disease      Past Surgical History: Past Surgical History  Procedure Laterality Date  . Hernia repair       Family History: Family History  Problem Relation Age of Onset  . CAD Other      Social History: Social History   Social History  . Marital Status: Married    Spouse Name: N/A  . Number of Children: N/A  . Years of Education: N/A   Occupational History  . Not on file.   Social History Main Topics  . Smoking status: Light Tobacco Smoker  . Smokeless tobacco: Not on file  . Alcohol Use: No  . Drug Use: No  . Sexual Activity: Not on file   Other Topics Concern  . Not on file   Social History Narrative  . No narrative on file     Review of  Systems: Review of Systems  Constitutional: Positive for fever, chills, malaise/fatigue and diaphoresis. Negative for weight loss.  HENT: Negative.  Negative for congestion, ear discharge, ear pain, hearing loss, nosebleeds, sore throat and tinnitus.   Eyes: Negative.  Negative for blurred vision, double vision, photophobia, pain, discharge and redness.  Respiratory: Negative.  Negative for cough, hemoptysis, sputum production, shortness of breath, wheezing and stridor.   Cardiovascular: Positive for leg swelling. Negative for chest pain, palpitations, orthopnea, claudication and PND.  Gastrointestinal: Positive for nausea, vomiting, abdominal pain, diarrhea and constipation. Negative for heartburn, blood in stool and melena.  Genitourinary: Positive for dysuria, urgency and frequency. Negative for hematuria and flank pain.  Musculoskeletal: Negative.  Negative for myalgias, back pain, joint pain, falls and neck pain.  Skin: Negative.  Negative for itching and rash.  Neurological: Positive for weakness. Negative for dizziness, tingling, tremors, sensory change, speech change, focal weakness, seizures, loss of consciousness and headaches.  Endo/Heme/Allergies: Negative for environmental allergies and polydipsia. Does not bruise/bleed easily.  Psychiatric/Behavioral: Negative.  Negative for depression, suicidal ideas, hallucinations, memory loss and substance abuse. The patient is not nervous/anxious and does not have insomnia.     Vital Signs: Blood pressure 110/55, pulse 78, temperature 97.8 F (36.6 C), temperature source Oral, resp. rate 20, height  (1.981 m), weight 145.151 kg (320 lb), SpO2 98 %.  Weight trends: Filed Weights   09/11/15 0934  Weight: 145.151 kg (320 lb)    Physical Exam: General: NAD, laying in bed  Head: Normocephalic, atraumatic. Moist oral mucosal membranes  Eyes: Anicteric, PERRL  Neck: Supple, trachea midline  Lungs:  Clear to auscultation  Heart:  Regular rate and rhythm  Abdomen:  Soft, nontender, obese  Extremities: no peripheral edema.  Neurologic: Nonfocal, moving all four extremities  Skin: No lesions        Lab results: Basic Metabolic Panel:  Recent Labs Lab 09/11/15 0937 09/11/15 1611 09/12/15 0346  NA 132* 133* 137  K 5.5* 5.2* 5.4*  CL 104 104 110  CO2 14* 16* 18*  GLUCOSE 100* 150* 104*  BUN 104*  97* 99*  CREATININE 6.90* 6.33* 5.98*  CALCIUM 9.6 9.4 8.7*    Liver Function Tests:  Recent Labs Lab 09/11/15 0937  AST 27  ALT 38  ALKPHOS 81  BILITOT 1.3*  PROT 7.3  ALBUMIN 2.8*    Recent Labs Lab 09/11/15 0937  LIPASE 57*   No results for input(s): AMMONIA in the last 168 hours.  CBC:  Recent Labs Lab 09/11/15 0937 09/12/15 0346  WBC 10.2 9.0  HGB 11.4* 9.9*  HCT 33.1* 28.8*  MCV 94.4 94.5  PLT 167 154    Cardiac Enzymes:  Recent Labs Lab 09/11/15 0937  TROPONINI <0.03    BNP: Invalid input(s): POCBNP  CBG: No results for input(s): GLUCAP in the last 168 hours.  Microbiology: Results for orders placed or performed during the hospital encounter of 09/11/15  Blood culture (routine x 2)     Status: None (Preliminary result)   Collection Time: 09/11/15  9:30 AM  Result Value Ref Range Status   Specimen Description BLOOD RIGHT ANTECUBITAL  Final   Special Requests BOTTLES DRAWN AEROBIC AND ANAEROBIC  2CC  Final   Culture NO GROWTH < 24 HOURS  Final   Report Status PENDING  Incomplete  Blood culture (routine x 2)     Status: None (Preliminary result)   Collection Time: 09/11/15 12:45 PM  Result Value Ref Range Status   Specimen Description BLOOD RIGHT ANTECUBITAL  Final   Special Requests BOTTLES DRAWN AEROBIC AND ANAEROBIC  5CC  Final   Culture NO GROWTH < 24 HOURS  Final   Report Status PENDING  Incomplete    Coagulation Studies: No results for input(s): LABPROT, INR in the last 72 hours.  Urinalysis:  Recent Labs  09/11/15 1146  COLORURINE YELLOW*   LABSPEC 1.010  PHURINE 5.0  GLUCOSEU NEGATIVE  HGBUR 2+*  BILIRUBINUR NEGATIVE  KETONESUR NEGATIVE  PROTEINUR 30*  NITRITE NEGATIVE  LEUKOCYTESUR 3+*      Imaging: US Renal  09/11/2015  CLINICAL DATA:  Acute renal injury EXAM: RENAL / URINARY TRACT ULTRASOUND COMPLETE COMPARISON:  None in PACs FINDINGS: Right Kidney: Length: 11.5 cm. The renal cortical echotexture is increased diffusely. There is no hydronephrosis. There is no discrete mass. Left Kidney: Length: 14.3 cm. There is severe hydronephrosis. There is cortical thinning. The echotexture of the thinned cortex is increased similar to that on the right. Bladder: The urinary bladder is only partially distended. Ureteral jets are not observed. IMPRESSION: 1. Severe left-sided hydronephrosis. 2. Moderately increased renal cortical echotexture bilaterally. Significant cortical thinning on the left. 3. Only partial distention of the urinary bladder. Electronically Signed   By: David  [place] M.D.   On: 09/11/2015 16:56      Assessment & Plan: Frederick Lee is a 55 y.o. Native American male with hypertension, gout, obesity, who was admitted to [facility] on 09/11/2015 for UTI (lower urinary tract infection) [N39.0] Acute renal insufficiency [N28.9]  1. Acute renal failure on chronic kidney disease stage IV: with left sided hydronephrosis.  reports baseline GFR of 21. History consistent with prerenal azotemia. Creatinine has improved, but BUN has not improved (could be due to steroids).  - Agree with IV fluids.  - Check baseline CKD labs - Consult urology.   2. Hypertension: low on admission. Holding lisinopril.  - metoprolol.   3. Urinary Tract Infection: culture pending. Empiric ceftriaxone.   LOS: 1 Frederick Lee 7/15/201711:10 AM

## 2015-09-12 NOTE — Progress Notes (Signed)
Initial Nutrition Assessment  DOCUMENTATION CODES:   Obesity unspecified  INTERVENTION:  -Monitor intake and cater to pt preferences. Reviewed diet restrictions briefly. May need to consider renal diet (limits Na and K) if intake >75% of meals and electrolytes do not improve   NUTRITION DIAGNOSIS:   Inadequate oral intake related to acute illness as evidenced by per patient/family report.    GOAL:   Patient will meet greater than or equal to 90% of their needs    MONITOR:   PO intake, Labs  REASON FOR ASSESSMENT:   Malnutrition Screening Tool    ASSESSMENT:      Pt admitted with weakness, abdominal pain, ARF, hyperkalmia, UTI  Past Medical History  Diagnosis Date  . Hypertension   . Gout   . Chronic kidney disease    Pt reports decreased intake for few days prior to admission, otherwise healthy appetite. Ate cereal, drank juice this am for breakfast and toast  Medications reviewed: colace, NS at 16925ml/hr Labs reviewed: K 5.4, BUN 99, creatinine 5.98, glucose 104  Nutrition-Focused physical exam completed. Findings are no fat depletion, no muscle depletion, and moderate edema.     Diet Order:  Diet Heart Room service appropriate?: Yes; Fluid consistency:: Thin  Skin:  Reviewed, no issues  Last BM:  7/15  Height:   Ht Readings from Last 1 Encounters:  09/11/15 6\' 6"  (1.981 m)    Weight: Pt reports wt loss of 20 pounds in the last few weeks (6% wt loss in the last few weeks)  Wt Readings from Last 1 Encounters:  09/11/15 320 lb (145.151 kg)    Ideal Body Weight:     BMI:  Body mass index is 36.99 kg/(m^2).  Estimated Nutritional Needs:   Kcal:  2400-2600 kcals/d  Protein:  120-130 g/d  Fluid:  >/=2 L/d  EDUCATION NEEDS:   No education needs identified at this time  Frederick Lee B. Frederick BusmanAllen, RD, LDN [phone] (pager) Weekend/On-Call pager [phone])

## 2015-09-12 NOTE — Consult Note (Addendum)
Urology Consult  I have been asked to see the patient by Dr. Wynelle Link, for evaluation and management of left hydronephrosis.  Chief Complaint: left flank pain  History of Present Illness: Frederick Lee is a 55 y.o. year old admitted yesterday to the hospitalist service with acute on chronic renal failure, malaise/weakness, left flank pain and urinary symptoms.  On admission, his creatinine was elevated to 6.9 with GFR of 8  (baseline GFR of 21), hemodynamically stable, afebrile, +UA,  and renal ultrasound with severe left hydronephrosis with severe cortical thinning.  He was started in her ceftriaxone with urine cultures pending.  Follow up noncontrast CT abd/ pelvis ordered today at the time of Urology consultation revealed massive left hydronephrosis without ureteral dilations and significant perinephric stranding/ edema/ fluid.    He does also have subjective fevers/ chills but no documented fevers since admission.  He does endorse gross hematuria intermittently over the past several months, and more recently dysuria, urinary urgency, and frequency.  He reports that he's had chronic intermittent left flank pain for many years. He reports that when he has this flank pain, he will take a laxative and his pain will improve significantly. He's never sought further workup for this. He is unaware of any history of kidney abnormalities other than chronic kidney disease.  Current smoker, variable amounts.   Personal history of kidney stones, no recent episode since early 1s. No previous surgical intervention for stones. Personal history of gout currently having gout flare.  In addition to the above, he has multiple other complaints including knee pain, diaphoresis, nausea, vomiting, diarrhea and constipation.   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  Past Medical History  Diagnosis Date  . Hypertension   . Gout   . Chronic kidney disease       Past Surgical History  Procedure Laterality Date  . Hernia repair      Home Medications:    Medication List    ASK your doctor about these medications        docusate sodium 100 MG capsule  Commonly known as:  COLACE  Take 100 mg by mouth 2 (two) times daily.     hydrALAZINE 25 MG tablet  Commonly known as:  APRESOLINE  Take 25 mg by mouth 3 (three) times daily.     lisinopril 20 MG tablet  Commonly known as:  PRINIVIL,ZESTRIL  Take 20 mg by mouth daily.     metoprolol 50 MG tablet  Commonly known as:  LOPRESSOR  Take 50 mg by mouth 2 (two) times daily.        Allergies: No Known Allergies  Family History  Problem Relation Age of Onset  . CAD Other     Social History:  reports that he has been smoking.  He does not have any smokeless tobacco history on file. He reports that he does not drink alcohol or use illicit drugs.  ROS: A complete review of systems was performed.  All systems are negative except for pertinent findings as noted.  Physical Exam:  Vital signs in last 24 hours: Temp:  [97.7 F (36.5 C)-98.3 F (36.8 C)] 97.8 F (36.6 C) (07/15 0515) Pulse Rate:  [78-102] 78 (07/15 0515) Resp:  [18-22] 20 (07/15 0515) BP: (110-155)/(49-121) 110/55 mmHg (07/15 0942) SpO2:  [96 %-100 %] 98 % (07/15 0942) Constitutional:  Alert and oriented, No acute distress.  Wife at bedside.   HEENT:  [place] AT, moist mucus membranes.  Trachea midline, no masses Cardiovascular: Regular rate and rhythm, no clubbing, cyanosis.  Mild bilateral LE edema.   Respiratory: Normal respiratory effort, lungs clear bilaterally GI: Abdomen is soft, nontender, nondistended, no abdominal masses.  + Umbilical hernia.   GU:+ Left flank tenderness with guarding.  No right flank tenderness.   Skin: No rashes, bruises or suspicious lesions Lymph: No cervical or inguinal adenopathy Neurologic: Grossly intact, no focal deficits, moving all 4 extremities Psychiatric: Normal mood and  affect   Laboratory Data:   Recent Labs  09/11/15 0937 09/12/15 0346  WBC 10.2 9.0  HGB 11.4* 9.9*  HCT 33.1* 28.8*    Recent Labs  09/11/15 0937 09/11/15 1611 09/12/15 0346  NA 132* 133* 137  K 5.5* 5.2* 5.4*  CL 104 104 110  CO2 14* 16* 18*  GLUCOSE 100* 150* 104*  BUN 104* 97* 99*  CREATININE 6.90* 6.33* 5.98*  CALCIUM 9.6 9.4 8.7*   No results for input(s): LABPT, INR in the last 72 hours. No results for input(s): LABURIN in the last 72 hours. Results for orders placed or performed during the hospital encounter of 09/11/15  Blood culture (routine x 2)     Status: None (Preliminary result)   Collection Time: 09/11/15  9:30 AM  Result Value Ref Range Status   Specimen Description BLOOD RIGHT ANTECUBITAL  Final   Special Requests BOTTLES DRAWN AEROBIC AND ANAEROBIC  2CC  Final   Culture NO GROWTH < 24 HOURS  Final   Report Status PENDING  Incomplete  Blood culture (routine x 2)     Status: None (Preliminary result)   Collection Time: 09/11/15 12:45 PM  Result Value Ref Range Status   Specimen Description BLOOD RIGHT ANTECUBITAL  Final   Special Requests BOTTLES DRAWN AEROBIC AND ANAEROBIC  5CC  Final   Culture NO GROWTH < 24 HOURS  Final   Report Status PENDING  Incomplete     Component     Latest Ref Rng 09/11/2015  Color, Urine     YELLOW YELLOW (A)  Appearance     CLEAR CLOUDY (A)  Glucose     NEGATIVE mg/dL NEGATIVE  Bilirubin Urine     NEGATIVE NEGATIVE  Ketones, ur     NEGATIVE mg/dL NEGATIVE  Specific Gravity, Urine     1.005 - 1.030 1.010  Hgb urine dipstick     NEGATIVE 2+ (A)  pH     5.0 - 8.0 5.0  Protein     NEGATIVE mg/dL 30 (A)  Nitrite     NEGATIVE NEGATIVE  Leukocytes, UA     NEGATIVE 3+ (A)  RBC / HPF     0 - 5 RBC/hpf TOO NUMEROUS TO COUNT  WBC, UA     0 - 5 WBC/hpf TOO NUMEROUS TO COUNT  Bacteria, UA     NONE SEEN MANY (A)  Squamous Epithelial / LPF     NONE SEEN 0-5 (A)  WBC Clumps      PRESENT   UCx 7/14: Negative  x 24 hours  Radiologic Imaging: [place] Renal  09/11/2015  CLINICAL DATA:  Acute renal injury EXAM: RENAL / URINARY TRACT ULTRASOUND COMPLETE COMPARISON:  None in PACs FINDINGS: Right Kidney: Length: 11.5 cm. The renal cortical echotexture is increased diffusely. There is no hydronephrosis. There is no discrete mass. Left Kidney: Length: 14.3 cm. There is severe hydronephrosis. There is cortical thinning. The echotexture of the thinned cortex is increased similar to that  on the right. Bladder: The urinary bladder is only partially distended. Ureteral jets are not observed. IMPRESSION: 1. Severe left-sided hydronephrosis. 2. Moderately increased renal cortical echotexture bilaterally. Significant cortical thinning on the left. 3. Only partial distention of the urinary bladder. Electronically Signed   By: David  [place] M.D.   On: 09/11/2015 16:56   Renal ultrasound personally reviewed   Impression/ Plan:  55 year old male with severe left hydronephrosis without clear etiology of obstruction with acute inflammation/ edema/ perinephric fluid. Obstruction appears to be chronic with evidence of a superimposed acute process.  1. Severe left hydronephrosis- differential diagnosis for severe left hydronephrosis includes congenital UPJ obstruction obstructing ureteral tumor at the level of the UPJ, or chronic ureteral stricture.  Perinephric edema/fluid is consistent with either possibly calyceal rupture versus superimposed infection.  In the setting of acute on chronic renal failure, severe left hydronephrosis, flank pain, positive UA suspicious for urinary tract infection, and significant perinephric edema/fluid, I have recommended decompression of the left collecting system in the form of ureteral stent. At the time of the procedure, we could perform a left retrograde pyelogram for diagnostic purposes.  We reviewed this in detail. Alternative options including continued observation and percutaneous nephrostomy  tube were discussed. We've reviewed the risks of the procedure including risk of pain, infection, need for further procedures, stent irritation, hematuria amongst others were discussed. He understands that he will ultimately need to have the stent removed and it should remain in place no longer than 3 months without further workup/intervention upon discharge. Close follow-up was stressed along with complications of retained stent reviewed.  He/ his wife have agreed to follow up with Urologist locally upon discharge.    2. Acute on chronic renal failure Based on the appearance of the left kidney with severe parenchymal thinning, it is unlikely the left kidney is contributing much to his overall renal function but will place ureteral stent in order to possibly help optimize his renal function.  With IV hydration and avoidance of nephrotoxic medications.  3. UTI Continue ceftriaxone, follow-up urine cultures and adjust as needed  4. Left flank pain As above, will pursue stent which may help with flank pain  5. Gross hematuria Will evaluate further at the time of cystoscopy. This is quite concerning for possible underlying malignancy. In the setting of infection, ureteroscopy is contraindicated. He will likely ultimately need ureteroscopy to rule out obstructing ureteral tumor bases unable to receive contrast for CT urogram.  09/12/2015, 12:00 PM  Vanna Scotland,  MD   Thank you for involving me in this patient's care

## 2015-09-12 NOTE — Anesthesia Preprocedure Evaluation (Addendum)
Anesthesia Evaluation  Patient identified by MRN, date of birth, ID band Patient awake  General Assessment Comment:NPO 0900, drank 1000  Reviewed: NPO status   History of Anesthesia Complications Negative for: history of anesthetic complications  Airway Mallampati: III  TM Distance: >3 FB Neck ROM: Full    Dental  (+) Partial Upper, Poor Dentition, Missing   Pulmonary Current Smoker (occassional smoker),    breath sounds clear to auscultation       Cardiovascular Exercise Tolerance: Poor hypertension, Pt. on medications and Pt. on home beta blockers (-) angina(-) CAD and (-) Past MI  Rhythm:Regular Rate:Normal + Systolic murmurs Limited activity d/t knee pain   Neuro/Psych neg Seizures    GI/Hepatic negative GI ROS, Neg liver ROS, neg GERD  ,(+) neg Cirrhosis      ,   Endo/Other  neg diabetesMorbid obesityOff synthroid since 1980's  Renal/GU ARFRenal disease     Musculoskeletal  (+) Arthritis  (bad knees, gout),   Abdominal (+) + obese,   Peds  Hematology   Anesthesia Other Findings Past Medical History:   Hypertension                                                 Gout                                                         Chronic kidney disease                                       Reproductive/Obstetrics negative OB ROS                           Anesthesia Physical Anesthesia Plan  ASA: III  Anesthesia Plan: General/Spinal and General   Post-op Pain Management:    Induction:   Airway Management Planned:   Additional Equipment:   Intra-op Plan:   Post-operative Plan:   Informed Consent: I have reviewed the patients History and Physical, chart, labs and discussed the procedure including the risks, benefits and alternatives for the proposed anesthesia with the patient or authorized representative who has indicated his/her understanding and acceptance.     Plan  Discussed with: Anesthesiologist, CRNA and Surgeon  Anesthesia Plan Comments:       Anesthesia Quick Evaluation

## 2015-09-12 NOTE — Op Note (Signed)
Date of procedure: 09/12/2015  Preoperative diagnosis:  1. Severe left hydronephrosis 2. UTI 3. Acute on chronic kidney injury   Postoperative diagnosis:  1. Same as above 2. Left UPJ obstruction  3. Left pyonephrosis  Procedure: 1. Cystoscopy 2. Left retrograde pyelogram 3. Left ureteral stent placement 4. Foley catheter placement  Surgeon: Vanna ScotlandAshley Damascus Feldpausch, MD  Anesthesia: MAC  Complications: None  Intraoperative findings: Left retrograde pyelogram consistent with UPJ obstruction, high insertion of the ureter with transition of the UPJ, decompressed ureter  EBL: minimal   Specimens: urine culture   Drains: 6 x 26 French left double-J ureteral stent, Bard Optima inlay  Indication: Frederick ShipperMelton Lee is a 55 y.o. patient with massive left hydronephrosis with perinephric stranding, left flank pain, and UTI, acute on chronic kidney injury.  After reviewing the management options for treatment, he elected to proceed with the above surgical procedure(s). We have discussed the potential benefits and risks of the procedure, side effects of the proposed treatment, the likelihood of the patient achieving the goals of the procedure, and any potential problems that might occur during the procedure or recuperation. Informed consent has been obtained.  Description of procedure:  The patient was taken to the operating room and monitored anesthesia care was induced.  The patient was placed in the dorsal lithotomy position, prepped and draped in the usual sterile fashion, and preoperative antibiotics were administered. A preoperative time-out was performed.   A rigid 21 JamaicaFrench scope was advanced per urethra into the bladder. Note, visualization within the bladder was initially very poor due to cloudy appearance of the urine. There is no masses within the bladder noted. Attention was turned to the left ureteral orifice was cannulated using a 5 JamaicaFrench open-ended ureteral catheter. A retrograde pyelogram  was performed using slowly and gently injected half diluted contrast solution. This revealed a completely decompressed ureter. A sensor wire was then placed up to level of the kidney without difficulty. The 5 JamaicaFrench open-ended ureteral catheter was advanced to the proximal ureter and the wire was withdrawn. Additional contrast solution was then injected at which time findings were consistent with a UPJ obstruction. There is a high insertion of his left ureter with a transition at the level of the UPJ. There was massive hydronephrosis noted. The wire was then advanced up into the renal pelvis and the 5 JamaicaFrench open-ended ureteral catheter was removed. A 6 x 26 French double-J ureteral stent was advanced over the wire up to level of the renal pelvis. The wire was partially drawn until full coil was noted within the renal pelvis. The wire was then fully withdrawn and a full coil was noted within the bladder. Upon placement of the stent, copious a flexible of purulent material was seen emanating from the distal tip of the stent. The scope was then removed. For maximal decompression of the collecting system, 16 French Foley catheter was placed in the balloon was of inflated with 10 cc of sterile water. The quality of the urine at this point was gray/gravy-like appearance. An additional urine culture was obtained. The patient was then person anesthesia and taken PACU in stable condition.   Vanna ScotlandAshley Kincade Granberg, M.D.

## 2015-09-13 LAB — BASIC METABOLIC PANEL
Anion gap: 9 (ref 5–15)
BUN: 95 mg/dL — AB (ref 6–20)
CALCIUM: 8.8 mg/dL — AB (ref 8.9–10.3)
CO2: 17 mmol/L — AB (ref 22–32)
CREATININE: 4.94 mg/dL — AB (ref 0.61–1.24)
Chloride: 112 mmol/L — ABNORMAL HIGH (ref 101–111)
GFR calc non Af Amer: 12 mL/min — ABNORMAL LOW (ref >60)
GFR, EST AFRICAN AMERICAN: 14 mL/min — AB (ref >60)
GLUCOSE: 121 mg/dL — AB (ref 65–99)
Potassium: 5.2 mmol/L — ABNORMAL HIGH (ref 3.5–5.1)
Sodium: 138 mmol/L (ref 135–145)

## 2015-09-13 NOTE — Anesthesia Postprocedure Evaluation (Signed)
Anesthesia Post Note  Patient: Frederick Lee  Procedure(s) Performed: Procedure(s) (LRB): CYSTOSCOPY WITH STENT PLACEMENT (Left)  Patient location during evaluation: PACU Anesthesia Type: General Level of consciousness: awake and alert Pain management: pain level controlled Vital Signs Assessment: post-procedure vital signs reviewed and stable Respiratory status: spontaneous breathing, nonlabored ventilation, respiratory function stable and patient connected to nasal cannula oxygen Cardiovascular status: blood pressure returned to baseline and stable Postop Assessment: no signs of nausea or vomiting Anesthetic complications: no    Last Vitals:  Filed Vitals:   09/12/15 2144 09/13/15 0551  BP: 120/61 131/58  Pulse: 86 66  Temp:  36.4 C  Resp: 18 18    Last Pain:  Filed Vitals:   09/13/15 0551  PainSc: 6                  Lenard SimmerAndrew Skyelynn Rambeau

## 2015-09-13 NOTE — Progress Notes (Addendum)
[facility] - [place] at [facility]   PATIENT NAME: Frederick Lee    MRN#:  [id number]  DATE OF BIRTH:  02-25-1961  SUBJECTIVE:  Hospital Day: 2 days Frederick Lee is a 55 y.o. male presenting with Weakness . Has generalized pain. Feels weak. No shortness of breath or vomiting.  Had left ureteral stent placed on 09/12/2015. Afebrile.  REVIEW OF SYSTEMS:  CONSTITUTIONAL: Positive fatigue or weakness.  EYES: No blurred or double vision.  EARS, NOSE, AND THROAT: No tinnitus or ear pain.  RESPIRATORY: No cough, shortness of breath, wheezing or hemoptysis.  CARDIOVASCULAR: No chest pain, orthopnea, edema.  GASTROINTESTINAL: No nausea, vomiting, diarrhea or abdominal pain.  GENITOURINARY: No dysuria, hematuria.  ENDOCRINE: No polyuria, nocturia,  HEMATOLOGY: No anemia, easy bruising or bleeding SKIN: No rash or lesion. MUSCULOSKELETAL: Positive joint pain NEUROLOGIC: No tingling, numbness, weakness.  PSYCHIATRY: No anxiety or depression.   DRUG ALLERGIES:  No Known Allergies  VITALS:  Blood pressure 138/74, pulse 74, temperature 97.6 F (36.4 C), temperature source Oral, resp. rate 18, height  (1.981 m), weight 145.151 kg (320 lb), SpO2 99 %.  PHYSICAL EXAMINATION:  VITAL SIGNS: Filed Vitals:   09/13/15 0551 09/13/15 0913  BP: 131/58 138/74  Pulse: 66 74  Temp: 97.6 F (36.4 C)   Resp: 18    GENERAL:54 y.o.male currently in no acute distress.  HEAD: Normocephalic, atraumatic.  EYES: Pupils equal, round, reactive to light. Extraocular muscles intact. No scleral icterus.  MOUTH: Moist mucosal membrane. Dentition intact. No abscess noted.  EAR, NOSE, THROAT: Clear without exudates. No external lesions.  NECK: Supple. No thyromegaly. No nodules. No JVD.  PULMONARY: Clear to ascultation, without wheeze rails or rhonci. No use of accessory muscles, Good respiratory effort. good air entry bilaterally CHEST: Nontender to palpation.  CARDIOVASCULAR:  S1 and S2. Regular rate and rhythm. No murmurs, rubs, or gallops. No edema. Pedal pulses 2+ bilaterally.  GASTROINTESTINAL: Soft, nontender, nondistended. No masses. Positive bowel sounds. No hepatosplenomegaly.  MUSCULOSKELETAL: Bilateral knees and right 4-5 MCP with erythema and edema and to touch otherwise No swelling, clubbing, or edema. Range of motion full in all extremities.  NEUROLOGIC: Cranial nerves II through XII are intact. No gross focal neurological deficits. Sensation intact. Reflexes intact.  SKIN: No ulceration, lesions, rashes, or cyanosis. Skin warm and dry. Turgor intact.  PSYCHIATRIC: Mood, affect within normal limits. The patient is awake, alert and oriented x 3. Insight, judgment intact.   LABORATORY PANEL:   CBC  Recent Labs Lab 09/12/15 0346  WBC 9.0  HGB 9.9*  HCT 28.8*  PLT 154   ------------------------------------------------------------------------------------------------------------------  Chemistries   Recent Labs Lab 09/11/15 0937  09/13/15 0807  NA 132*  < > 138  K 5.5*  < > 5.2*  CL 104  < > 112*  CO2 14*  < > 17*  GLUCOSE 100*  < > 121*  BUN 104*  < > 95*  CREATININE 6.90*  < > 4.94*  CALCIUM 9.6  < > 8.8*  AST 27  --   --   ALT 38  --   --   ALKPHOS 81  --   --   BILITOT 1.3*  --   --   < > = values in this interval not displayed. ------------------------------------------------------------------------------------------------------------------  Cardiac Enzymes  Recent Labs Lab 09/11/15 0937  TROPONINI <0.03   ------------------------------------------------------------------------------------------------------------------  RADIOLOGY:  Ct Abdomen Pelvis Wo Contrast  09/12/2015  CLINICAL DATA:  Fever and chills. EXAM: CT  ABDOMEN AND PELVIS WITHOUT CONTRAST TECHNIQUE: Multidetector CT imaging of the abdomen and pelvis was performed following the standard protocol without IV contrast. COMPARISON:  Ultrasound exam earlier today.  FINDINGS: Lower chest:  Subsegmental atelectasis. Hepatobiliary: No focal abnormality in the liver on this study without intravenous contrast. No evidence of hepatomegaly. There is no evidence for gallstones, gallbladder wall thickening, or pericholecystic fluid. No intrahepatic or extrahepatic biliary dilation. Pancreas: No focal mass lesion. No dilatation of the main duct. No intraparenchymal cyst. No peripancreatic edema. Spleen: No splenomegaly. No focal mass lesion. Adrenals/Urinary Tract: No adrenal nodule or mass. Right kidney normal uninfused features. There is dramatic hydronephrosis of the left kidney with overlying thinning of the cortex and a 2.3 x 5.3 cm exophytic cystic component antral laterally. Fluid/edema is identified in the left perinephric fat. There is no associated left hydroureter although extraperitoneal edema does track caudally in the left pelvic sidewall down towards the pelvis. Right ureter is normal. The urinary bladder appears normal for the degree of distention. Stomach/Bowel: Stomach is nondistended. No gastric wall thickening. No evidence of outlet obstruction. Duodenum is normally positioned as is the ligament of Treitz. No small bowel wall thickening. No small bowel dilatation. The terminal ileum is normal. The appendix is normal. Diverticular changes are noted in the left colon without evidence of diverticulitis. Vascular/Lymphatic: There is abdominal aortic atherosclerosis without aneurysm. There is no gastrohepatic or hepatoduodenal ligament lymphadenopathy. No intraperitoneal or retroperitoneal lymphadenopathy. No pelvic sidewall lymphadenopathy. Reproductive: The prostate gland and seminal vesicles have normal imaging features. Other: No intraperitoneal free fluid. Musculoskeletal: Bone windows reveal no worrisome lytic or sclerotic osseous lesions. Umbilical hernia contains only fat. IMPRESSION: 1. Marked left-sided hydronephrosis with associated perinephric  edema/inflammation tracking in the extraperitoneal soft tissues down towards the pelvis. Imaging features may be related to caliceal rupture due to obstruction at the level of the UPJ. Superinfection of a hydronephrotic left kidney would also be a consideration. No evidence for left urinary stone disease. In the absence of a congenital UPJ obstruction, urothelial neoplasm must be considered. 2. Umbilical hernia contains only fat. Electronically Signed   By: Kennith Center M.D.   On: 09/12/2015 14:03   US Renal  09/11/2015  CLINICAL DATA:  Acute renal injury EXAM: RENAL / URINARY TRACT ULTRASOUND COMPLETE COMPARISON:  None in PACs FINDINGS: Right Kidney: Length: 11.5 cm. The renal cortical echotexture is increased diffusely. There is no hydronephrosis. There is no discrete mass. Left Kidney: Length: 14.3 cm. There is severe hydronephrosis. There is cortical thinning. The echotexture of the thinned cortex is increased similar to that on the right. Bladder: The urinary bladder is only partially distended. Ureteral jets are not observed. IMPRESSION: 1. Severe left-sided hydronephrosis. 2. Moderately increased renal cortical echotexture bilaterally. Significant cortical thinning on the left. 3. Only partial distention of the urinary bladder. Electronically Signed   By: David  Swaziland M.D.   On: 09/11/2015 16:56    EKG:  No orders found for this or any previous visit.  ASSESSMENT AND PLAN:   Frederick Lee is a 55 y.o. male presenting with Weakness . Admitted 09/11/2015 : Day #: 2 days   1. Acute kidney injury on chronic kidney disease unknown stage Due to left UPJ obstruction. Status post stent placement on 09/12/2015 Improving renal status. Creatinine improving. Continue to monitor input and output. Will discontinue Foley catheter. Stressed the importance of recording urine output with patient. Appreciate nephrology and urology help.  2. Hyperkalemia: Continue IV fluid hydration Improving  3.  Acute gout  flare: Treatment options somewhat limited given kidney function will have pain medications and steroids  4. Urinary tract infection: Continue IV antibiotics - Ceftriaxone Initial culture showed multiple species. Cultures repeated.  5. Essential hypertension We'll controlled on metoprolol.  6. Venous thromboembolism prophylactic: Heparin   All the records are reviewed and case discussed with Care Management/Social Workerr. Management plans discussed with the patient, family and they are in agreement.  CODE STATUS: full TOTAL TIME TAKING CARE OF THIS PATIENT: 35 minutes.   POSSIBLE D/C IN 2-3DAYS, DEPENDING ON CLINICAL CONDITION.   Milagros LollSudini, Jamoni Broadfoot R M.D on 09/13/2015 at 9:19 AM  Between 7am to 6pm - Pager - [phone]  After 6pm: House Pager: - [phone]  [facility]  Office  [phone]  CC: Primary care physician; Pcp Not In System

## 2015-09-13 NOTE — Progress Notes (Addendum)
Encouraged patient to ambulate this afternoon.  Patient pulled self to standing position using walker and ambulated around nursing station.  Has some knee pain and swelling and he verbalized there was some pain in knees with walking, otherwise tolerated without difficulty. Loel RoVerne H. Shauniece Kwan, RN 09/13/15 at 1600

## 2015-09-13 NOTE — Progress Notes (Signed)
Catheter removed at 935am. Patient has voided this afternoon twice without difficulty.  Urine is clear and yellow.  Complained of knee pain this am and not willing to ambulate hall.   Loel RoVerne H. Brittaney Beaulieu, RN 09/13/15 1536

## 2015-09-13 NOTE — Progress Notes (Signed)
[facility]  ROUNDING NOTE   Subjective:   Cystoscopy with left ureteral stent placement yesterday evening by Dr. Apolinar JunesBrandon.   Objective:  Vital signs in last 24 hours:  Temp:  [97.5 F (36.4 C)-98.4 F (36.9 C)] 97.6 F (36.4 C) (07/16 0551) Pulse Rate:  [66-92] 74 (07/16 0913) Resp:  [16-19] 18 (07/16 0551) BP: (103-145)/(44-74) 138/74 mmHg (07/16 0913) SpO2:  [93 %-100 %] 99 % (07/16 0913)  Weight change:  Filed Weights   09/11/15 0934  Weight: 145.151 kg (320 lb)    Intake/Output: I/O last 3 completed shifts: In: 3082 [I.V.:3082] Out: 1222 [Urine:1220; Stool:2]   Intake/Output this shift:  Total I/O In: 240 [P.O.:240] Out: -   Physical Exam: General: NAD, laying in bed  Head: Normocephalic, atraumatic. Moist oral mucosal membranes  Eyes: Anicteric, PERRL  Neck: Supple, trachea midline  Lungs:  Clear to auscultation  Heart: Regular rate and rhythm  Abdomen:  Soft, nontender, obese  Extremities: trace peripheral edema.  Neurologic: Nonfocal, moving all four extremities  Skin: No lesions       Basic Metabolic Panel:  Recent Labs Lab 09/11/15 0937 09/11/15 1611 09/12/15 0346 09/13/15 0807  NA 132* 133* 137 138  K 5.5* 5.2* 5.4* 5.2*  CL 104 104 110 112*  CO2 14* 16* 18* 17*  GLUCOSE 100* 150* 104* 121*  BUN 104* 97* 99* 95*  CREATININE 6.90* 6.33* 5.98* 4.94*  CALCIUM 9.6 9.4 8.7* 8.8*    Liver Function Tests:  Recent Labs Lab 09/11/15 0937  AST 27  ALT 38  ALKPHOS 81  BILITOT 1.3*  PROT 7.3  ALBUMIN 2.8*    Recent Labs Lab 09/11/15 0937  LIPASE 57*   No results for input(s): AMMONIA in the last 168 hours.  CBC:  Recent Labs Lab 09/11/15 0937 09/12/15 0346  WBC 10.2 9.0  HGB 11.4* 9.9*  HCT 33.1* 28.8*  MCV 94.4 94.5  PLT 167 154    Cardiac Enzymes:  Recent Labs Lab 09/11/15 0937  TROPONINI <0.03    BNP: Invalid input(s): POCBNP  CBG: No results for input(s): GLUCAP in the last 168  hours.  Microbiology: Results for orders placed or performed during the hospital encounter of 09/11/15  Blood culture (routine x 2)     Status: None (Preliminary result)   Collection Time: 09/11/15  9:30 AM  Result Value Ref Range Status   Specimen Description BLOOD RIGHT ANTECUBITAL  Final   Special Requests BOTTLES DRAWN AEROBIC AND ANAEROBIC  2CC  Final   Culture NO GROWTH < 24 HOURS  Final   Report Status PENDING  Incomplete  Urine culture     Status: Abnormal   Collection Time: 09/11/15 11:46 AM  Result Value Ref Range Status   Specimen Description URINE, RANDOM  Final   Special Requests NONE  Final   Culture MULTIPLE SPECIES PRESENT, SUGGEST RECOLLECTION (A)  Final   Report Status 09/12/2015 FINAL  Final  Blood culture (routine x 2)     Status: None (Preliminary result)   Collection Time: 09/11/15 12:45 PM  Result Value Ref Range Status   Specimen Description BLOOD RIGHT ANTECUBITAL  Final   Special Requests BOTTLES DRAWN AEROBIC AND ANAEROBIC  5CC  Final   Culture NO GROWTH < 24 HOURS  Final   Report Status PENDING  Incomplete  MRSA PCR Screening     Status: None   Collection Time: 09/12/15  3:41 PM  Result Value Ref Range Status   MRSA by PCR NEGATIVE  NEGATIVE Final    Comment:        The GeneXpert MRSA Assay (FDA approved for NASAL specimens only), is one component of a comprehensive MRSA colonization surveillance program. It is not intended to diagnose MRSA infection nor to guide or monitor treatment for MRSA infections.     Coagulation Studies: No results for input(s): LABPROT, INR in the last 72 hours.  Urinalysis:  Recent Labs  09/11/15 1146  COLORURINE YELLOW*  LABSPEC 1.010  PHURINE 5.0  GLUCOSEU NEGATIVE  HGBUR 2+*  BILIRUBINUR NEGATIVE  KETONESUR NEGATIVE  PROTEINUR 30*  NITRITE NEGATIVE  LEUKOCYTESUR 3+*      Imaging: Ct Abdomen Pelvis Wo Contrast  09/12/2015  CLINICAL DATA:  Fever and chills. EXAM: CT ABDOMEN AND PELVIS WITHOUT  CONTRAST TECHNIQUE: Multidetector CT imaging of the abdomen and pelvis was performed following the standard protocol without IV contrast. COMPARISON:  Ultrasound exam earlier today. FINDINGS: Lower chest:  Subsegmental atelectasis. Hepatobiliary: No focal abnormality in the liver on this study without intravenous contrast. No evidence of hepatomegaly. There is no evidence for gallstones, gallbladder wall thickening, or pericholecystic fluid. No intrahepatic or extrahepatic biliary dilation. Pancreas: No focal mass lesion. No dilatation of the main duct. No intraparenchymal cyst. No peripancreatic edema. Spleen: No splenomegaly. No focal mass lesion. Adrenals/Urinary Tract: No adrenal nodule or mass. Right kidney normal uninfused features. There is dramatic hydronephrosis of the left kidney with overlying thinning of the cortex and a 2.3 x 5.3 cm exophytic cystic component antral laterally. Fluid/edema is identified in the left perinephric fat. There is no associated left hydroureter although extraperitoneal edema does track caudally in the left pelvic sidewall down towards the pelvis. Right ureter is normal. The urinary bladder appears normal for the degree of distention. Stomach/Bowel: Stomach is nondistended. No gastric wall thickening. No evidence of outlet obstruction. Duodenum is normally positioned as is the ligament of Treitz. No small bowel wall thickening. No small bowel dilatation. The terminal ileum is normal. The appendix is normal. Diverticular changes are noted in the left colon without evidence of diverticulitis. Vascular/Lymphatic: There is abdominal aortic atherosclerosis without aneurysm. There is no gastrohepatic or hepatoduodenal ligament lymphadenopathy. No intraperitoneal or retroperitoneal lymphadenopathy. No pelvic sidewall lymphadenopathy. Reproductive: The prostate gland and seminal vesicles have normal imaging features. Other: No intraperitoneal free fluid. Musculoskeletal: Bone windows  reveal no worrisome lytic or sclerotic osseous lesions. Umbilical hernia contains only fat. IMPRESSION: 1. Marked left-sided hydronephrosis with associated perinephric edema/inflammation tracking in the extraperitoneal soft tissues down towards the pelvis. Imaging features may be related to caliceal rupture due to obstruction at the level of the UPJ. Superinfection of a hydronephrotic left kidney would also be a consideration. No evidence for left urinary stone disease. In the absence of a congenital UPJ obstruction, urothelial neoplasm must be considered. 2. Umbilical hernia contains only fat. Electronically Signed   By: Kennith Center M.D.   On: 09/12/2015 14:03   US Renal  09/11/2015  CLINICAL DATA:  Acute renal injury EXAM: RENAL / URINARY TRACT ULTRASOUND COMPLETE COMPARISON:  None in PACs FINDINGS: Right Kidney: Length: 11.5 cm. The renal cortical echotexture is increased diffusely. There is no hydronephrosis. There is no discrete mass. Left Kidney: Length: 14.3 cm. There is severe hydronephrosis. There is cortical thinning. The echotexture of the thinned cortex is increased similar to that on the right. Bladder: The urinary bladder is only partially distended. Ureteral jets are not observed. IMPRESSION: 1. Severe left-sided hydronephrosis. 2. Moderately increased renal cortical echotexture bilaterally. Significant  cortical thinning on the left. 3. Only partial distention of the urinary bladder. Electronically Signed   By: David  Swaziland M.D.   On: 09/11/2015 16:56     Medications:   . sodium chloride 125 mL/hr at 09/13/15 0523   . cefTRIAXone (ROCEPHIN)  IV  1 g Intravenous Q24H  . docusate sodium  100 mg Oral BID  . heparin  5,000 Units Subcutaneous Q8H  . metoprolol  50 mg Oral BID  . predniSONE  40 mg Oral Q breakfast  . sodium chloride flush  3 mL Intravenous Q12H   acetaminophen **OR** acetaminophen, morphine injection, ondansetron **OR** ondansetron (ZOFRAN) IV, oxyCODONE  Assessment/  Plan:  Mr. Frederick Lee is a 55 y.o. Native American male with hypertension, gout, obesity, who was admitted to [facility] on 09/11/2015 for UTI (lower urinary tract infection) [N39.0] Acute renal insufficiency [N28.9]  1. Acute renal failure on chronic kidney disease stage IV: with left sided hydronephrosis. 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  Status post left ureteral stent placement on 7/15 by Dr. Apolinar Junes.  Patient reports baseline GFR of 21.  Follows with a nephrologist in [place], [place].  - Agree with IV fluids. Strict I&Os.  - Continue to monitor volume status, urine output and renal function. No acute indication for dialysis.  - Appreciate urology input.   2. Hypertension: low on admission. Holding lisinopril.  - metoprolol.   3. Urinary Tract Infection: culture pending. Empiric ceftriaxone.  4. Gout: on prednisone.    LOS: 2 Lucia Mccreadie 7/16/201710:32 AM

## 2015-09-14 ENCOUNTER — Encounter: Payer: Self-pay | Admitting: Urology

## 2015-09-14 LAB — URINE CULTURE: Culture: NO GROWTH

## 2015-09-14 LAB — BASIC METABOLIC PANEL
ANION GAP: 6 (ref 5–15)
BUN: 88 mg/dL — ABNORMAL HIGH (ref 6–20)
CALCIUM: 8.8 mg/dL — AB (ref 8.9–10.3)
CHLORIDE: 119 mmol/L — AB (ref 101–111)
CO2: 17 mmol/L — AB (ref 22–32)
CREATININE: 4.06 mg/dL — AB (ref 0.61–1.24)
GFR calc non Af Amer: 15 mL/min — ABNORMAL LOW (ref >60)
GFR, EST AFRICAN AMERICAN: 18 mL/min — AB (ref >60)
Glucose, Bld: 117 mg/dL — ABNORMAL HIGH (ref 65–99)
Potassium: 5.6 mmol/L — ABNORMAL HIGH (ref 3.5–5.1)
SODIUM: 142 mmol/L (ref 135–145)

## 2015-09-14 LAB — MAGNESIUM: MAGNESIUM: 1.8 mg/dL (ref 1.7–2.4)

## 2015-09-14 MED ORDER — POLYETHYLENE GLYCOL 3350 17 G PO PACK: 17.0000 g | PACK | Freq: Every day | ORAL | Status: DC | PRN

## 2015-09-14 MED ORDER — SODIUM POLYSTYRENE SULFONATE 15 GM/60ML PO SUSP
30.0000 g | Freq: Once | ORAL | Status: AC
  Administered 2015-09-14: 30 g via ORAL
  Filled 2015-09-14: qty 120

## 2015-09-14 MED ORDER — SODIUM BICARBONATE 650 MG PO TABS
650.0000 mg | ORAL_TABLET | Freq: Two times a day (BID) | ORAL | Status: DC
  Administered 2015-09-14 – 2015-09-16 (×6): 650 mg via ORAL
  Filled 2015-09-14 (×6): qty 1

## 2015-09-14 MED ORDER — SENNOSIDES-DOCUSATE SODIUM 8.6-50 MG PO TABS
2.0000 | ORAL_TABLET | Freq: Two times a day (BID) | ORAL | Status: DC
  Administered 2015-09-14 – 2015-09-17 (×7): 2 via ORAL
  Filled 2015-09-14 (×7): qty 2

## 2015-09-14 NOTE — Progress Notes (Signed)
Urology Consult Follow Up  Subjective: Ucx negative to date.  Flank pain improving, urine clearing, renal fx slowly trending to baseline.    Anti-infectives: Anti-infectives    Start     Dose/Rate Route Frequency Ordered Stop   09/12/15 1000  cefTRIAXone (ROCEPHIN) 1 g in dextrose 5 % 50 mL IVPB  Status:  Discontinued     1 g 100 mL/hr over 30 Minutes Intravenous Every 24 hours 09/11/15 1452 09/14/15 1352   09/11/15 1230  cefTRIAXone (ROCEPHIN) 1 g in dextrose 5 % 50 mL IVPB     1 g 100 mL/hr over 30 Minutes Intravenous  Once 09/11/15 1222 09/11/15 1326      Current Facility-Administered Medications  Medication Dose Route Frequency Provider Last Rate Last Dose  . 0.9 %  sodium chloride infusion   Intravenous Continuous Milagros LollSrikar Sudini, MD 75 mL/hr at 09/14/15 1503    . acetaminophen (TYLENOL) tablet 650 mg  650 mg Oral Q6H PRN Wyatt Hasteavid K Hower, MD       Or  . acetaminophen (TYLENOL) suppository 650 mg  650 mg Rectal Q6H PRN Wyatt Hasteavid K Hower, MD      . heparin injection 5,000 Units  5,000 Units Subcutaneous Q8H Wyatt Hasteavid K Hower, MD   5,000 Units at 09/14/15 1457  . metoprolol (LOPRESSOR) tablet 50 mg  50 mg Oral BID Wyatt Hasteavid K Hower, MD   50 mg at 09/14/15 1015  . morphine 2 MG/ML injection 2 mg  2 mg Intravenous Q4H PRN Wyatt Hasteavid K Hower, MD   2 mg at 09/13/15 1619  . ondansetron (ZOFRAN) tablet 4 mg  4 mg Oral Q6H PRN Wyatt Hasteavid K Hower, MD       Or  . ondansetron [facility](ZOFRAN) injection 4 mg  4 mg Intravenous Q6H PRN Wyatt Hasteavid K Hower, MD   4 mg at 09/11/15 1302  . oxyCODONE (Oxy IR/ROXICODONE) immediate release tablet 5 mg  5 mg Oral Q4H PRN Wyatt Hasteavid K Hower, MD   5 mg at 09/14/15 1503  . polyethylene glycol (MIRALAX / GLYCOLAX) packet 17 g  17 g Oral Daily PRN Srikar Sudini, MD      . predniSONE (DELTASONE) tablet 40 mg  40 mg Oral Q breakfast Wyatt Hasteavid K Hower, MD   40 mg at 09/14/15 [id number]  . senna-docusate (Senokot-S) tablet 2 tablet  2 tablet Oral BID Milagros LollSrikar Sudini, MD   2 tablet at 09/14/15 1457  . sodium  bicarbonate tablet 650 mg  650 mg Oral BID Milagros LollSrikar Sudini, MD   650 mg at 09/14/15 1457  . sodium chloride flush (NS) 0.9 % injection 3 mL  3 mL Intravenous Q12H Wyatt Hasteavid K Hower, MD   3 mL at 09/13/15 2103     Objective: Vital signs in last 24 hours: Temp:  [97.6 F (36.4 C)-98 F (36.7 C)] 97.6 F (36.4 C) (07/17 1415) Pulse Rate:  [61-73] 73 (07/17 1415) Resp:  [18-20] 18 (07/17 1415) BP: (141-166)/(62-80) 166/80 mmHg (07/17 1415) SpO2:  [97 %-99 %] 98 % (07/17 1415)  Intake/Output from previous day: 07/16 0701 - 07/17 0700 In: 3094 [P.O.:240; I.V.:2804; IV Piggyback:50] Out: 725 [Urine:725] Intake/Output this shift: Total I/O In: 757.6 [I.V.:757.6] Out: 1101 [Urine:1100; Stool:1]   Physical Exam  Constitutional: He is oriented to person, place, and time and well-developed, well-nourished, and in no distress.  Wife at bedside  HENT:  Head: Normocephalic and atraumatic.  Neck: Normal range of motion. Neck supple.  Cardiovascular: Normal rate.   Pulmonary/Chest: Effort normal.  Abdominal: Soft. Bowel  sounds are normal.  Neurological: He is alert and oriented to person, place, and time.  Skin: Skin is warm and dry.  Psychiatric: Affect normal.    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

## 2015-09-14 NOTE — Progress Notes (Signed)
Pt has been asking about ambulating in halls. So far, pt has only ambulated in his room to and from the bathroom. Pt c/o knee pain preventing him from walking. Will continue to encourage ambulation.

## 2015-09-14 NOTE — Progress Notes (Signed)
[facility] [place] [facility]   PATIENT NAME: Frederick Lee    MRN#:  [id number]  DATE OF BIRTH:  December 20, 1960  SUBJECTIVE:  Hospital Day: 3 days Frederick Lee is a 55 y.o. male presenting with Weakness . Has generalized pain, more in knees.  No shortness of breath or vomiting. Good urine output  Had left ureteral stent placed on 09/12/2015. Afebrile.  REVIEW OF SYSTEMS:  CONSTITUTIONAL: Positive fatigue or weakness.  EYES: No blurred or double vision.  EARS, NOSE, AND THROAT: No tinnitus or ear pain.  RESPIRATORY: No cough, shortness of breath, wheezing or hemoptysis.  CARDIOVASCULAR: No chest pain, orthopnea, edema.  GASTROINTESTINAL: No nausea, vomiting, diarrhea or abdominal pain.  GENITOURINARY: No dysuria, hematuria.  ENDOCRINE: No polyuria, nocturia,  HEMATOLOGY: No anemia, easy bruising or bleeding SKIN: No rash or lesion. MUSCULOSKELETAL: Positive joint pain NEUROLOGIC: No tingling, numbness, weakness.  PSYCHIATRY: No anxiety or depression.   DRUG ALLERGIES:  No Known Allergies  VITALS:  Blood pressure 147/72, pulse 61, temperature 97.8 F (36.6 C), temperature source Oral, resp. rate 20, height 6\' 6"  (1.981 m), weight 145.151 kg (320 lb), SpO2 99 %.  PHYSICAL EXAMINATION:  VITAL SIGNS: Filed Vitals:   09/13/15 2252 09/14/15 0508  BP: 141/62 147/72  Pulse: 61 61  Temp: 98 F (36.7 C) 97.8 F (36.6 C)  Resp:  20   GENERAL:54 y.o.male currently in no acute distress.  HEAD: Normocephalic, atraumatic.  EYES: Pupils equal, round, reactive to light. Extraocular muscles intact. No scleral icterus.  MOUTH: Moist mucosal membrane. Dentition intact. No abscess noted.  EAR, NOSE, THROAT: Clear without exudates. No external lesions.  NECK: Supple. No thyromegaly. No nodules. No JVD.  PULMONARY: Clear to ascultation, without wheeze rails or rhonci. No use of accessory muscles, Good respiratory effort. good air entry bilaterally CHEST:  Nontender to palpation.  CARDIOVASCULAR: S1 and S2. Regular rate and rhythm. No murmurs, rubs, or gallops. No edema. Pedal pulses 2+ bilaterally.  GASTROINTESTINAL: Soft, nontender, nondistended. No masses. Positive bowel sounds. No hepatosplenomegaly.  MUSCULOSKELETAL: Bilateral knees and right 4-5 MCP with erythema and edema and to touch otherwise No swelling, clubbing, or edema. Range of motion full in all extremities.  NEUROLOGIC: Cranial nerves II through XII are intact. No gross focal neurological deficits. Sensation intact. Reflexes intact.  SKIN: No ulceration, lesions, rashes, or cyanosis. Skin warm and dry. Turgor intact.  PSYCHIATRIC: Mood, affect within normal limits. The patient is awake, alert and oriented x 3. Insight, judgment intact.   LABORATORY PANEL:   CBC  Recent Labs Lab 09/12/15 0346  WBC 9.0  HGB 9.9*  HCT 28.8*  PLT 154   ------------------------------------------------------------------------------------------------------------------  Chemistries   Recent Labs Lab 09/11/15 0937  09/14/15 0557  NA 132*  < > 142  K 5.5*  < > 5.6*  CL 104  < > 119*  CO2 14*  < > 17*  GLUCOSE 100*  < > 117*  BUN 104*  < > 88*  CREATININE 6.90*  < > 4.06*  CALCIUM 9.6  < > 8.8*  MG  --   --  1.8  AST 27  --   --   ALT 38  --   --   ALKPHOS 81  --   --   BILITOT 1.3*  --   --   < > = values in this interval not displayed. ------------------------------------------------------------------------------------------------------------------  Cardiac Enzymes  Recent Labs Lab 09/11/15 0937  TROPONINI <0.03   ------------------------------------------------------------------------------------------------------------------  RADIOLOGY:  Ct Abdomen Pelvis Wo Contrast  09/12/2015  CLINICAL DATA:  Fever and chills. EXAM: CT ABDOMEN AND PELVIS WITHOUT CONTRAST TECHNIQUE: Multidetector CT imaging of the abdomen and pelvis was performed following the standard protocol without  IV contrast. COMPARISON:  Ultrasound exam earlier today. FINDINGS: Lower chest:  Subsegmental atelectasis. Hepatobiliary: No focal abnormality in the liver on this study without intravenous contrast. No evidence of hepatomegaly. There is no evidence for gallstones, gallbladder wall thickening, or pericholecystic fluid. No intrahepatic or extrahepatic biliary dilation. Pancreas: No focal mass lesion. No dilatation of the main duct. No intraparenchymal cyst. No peripancreatic edema. Spleen: No splenomegaly. No focal mass lesion. Adrenals/Urinary Tract: No adrenal nodule or mass. Right kidney normal uninfused features. There is dramatic hydronephrosis of the left kidney with overlying thinning of the cortex and a 2.3 x 5.3 cm exophytic cystic component antral laterally. Fluid/edema is identified in the left perinephric fat. There is no associated left hydroureter although extraperitoneal edema does track caudally in the left pelvic sidewall down towards the pelvis. Right ureter is normal. The urinary bladder appears normal for the degree of distention. Stomach/Bowel: Stomach is nondistended. No gastric wall thickening. No evidence of outlet obstruction. Duodenum is normally positioned as is the ligament of Treitz. No small bowel wall thickening. No small bowel dilatation. The terminal ileum is normal. The appendix is normal. Diverticular changes are noted in the left colon without evidence of diverticulitis. Vascular/Lymphatic: There is abdominal aortic atherosclerosis without aneurysm. There is no gastrohepatic or hepatoduodenal ligament lymphadenopathy. No intraperitoneal or retroperitoneal lymphadenopathy. No pelvic sidewall lymphadenopathy. Reproductive: The prostate gland and seminal vesicles have normal imaging features. Other: No intraperitoneal free fluid. Musculoskeletal: Bone windows reveal no worrisome lytic or sclerotic osseous lesions. Umbilical hernia contains only fat. IMPRESSION: 1. Marked left-sided  hydronephrosis with associated perinephric edema/inflammation tracking in the extraperitoneal soft tissues down towards the pelvis. Imaging features may be related to caliceal rupture due to obstruction at the level of the UPJ. Superinfection of a hydronephrotic left kidney would also be a consideration. No evidence for left urinary stone disease. In the absence of a congenital UPJ obstruction, urothelial neoplasm must be considered. 2. Umbilical hernia contains only fat. Electronically Signed   By: Kennith Center M.D.   On: 09/12/2015 14:03    EKG:  No orders found for this or any previous visit.  ASSESSMENT AND PLAN:   Frederick Lee is a 55 y.o. male presenting with Weakness . Admitted 09/11/2015 : Day #: 3 days   1. 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 Status post stent placement on 09/12/2015 Improving renal status. Creatinine improving. Continue to monitor input and output. Appreciate nephrology and urology help.  2. Hyperkalemia: Continue IV fluid hydration Kayexalate dose and add bicarb tabs  3. Acute gout flare: Treatment options somewhat limited given kidney function will have pain medications and steroids  4. Urinary tract infection: Continue IV antibiotics - Ceftriaxone Initial culture showed multiple species. Cultures repeated.  5. Essential hypertension  metoprolol.  6. Venous thromboembolism prophylactic: Heparin   All the records are reviewed and case discussed with Care Management/Social Workerr. Management plans discussed with the patient, family and they are in agreement.  CODE STATUS: full   TOTAL TIME TAKING CARE OF THIS PATIENT: 35 minutes.   POSSIBLE D/C IN 1-2 DAYS, DEPENDING ON CLINICAL CONDITION.   Milagros Loll R M.D on 09/14/2015 at 11:54 AM  Between 7am to 6pm - Pager - [phone]  After 6pm: House Pager: - [phone]  [facility]  Office  [phone]  CC: Primary  care physician; Pcp Not In System

## 2015-09-14 NOTE — Progress Notes (Signed)
Subjective:  Overall feels better aft er a Serum creatinine has improved to 4.06  BUN has improved to  Patient states that he is voiding more.  Urine output reported at 900 cc Urine is clearing   Objective:  Vital signs in last 24 hours:  Temp:  [97.6 F (36.4 C)-98 F (36.7 C)] 97.6 F (36.4 C) (07/17 1415) Pulse Rate:  [61-73] 73 (07/17 1415) Resp:  [18-20] 18 (07/17 1415) BP: (141-166)/(62-80) 166/80 mmHg (07/17 1415) SpO2:  [97 %-99 %] 98 % (07/17 1415)  Weight change:  Filed Weights   09/11/15 0934  Weight: 145.151 kg (320 lb)    Intake/Output:    Intake/Output Summary (Last 24 hours) at 09/14/15 1451 Last data filed at 09/14/15 1440  Gross per 24 hour  Intake 2762.64 ml  Output   1100 ml  Net 1662.64 ml     Physical Exam: General: No acute distress, sitting up in bed  HEENT Anicteric, moist mucous membranes  Neck supple  Pulm/lungs Normal breathing effort, clear to auscultation  CVS/Heart Regular rhythm, no rub or gallop  Abdomen:  Soft, nontender, nondistended  Extremities: Trace peripheral edema, bilateral knee swelling from acute gout  Neurologic: Alert, oriented  Skin: No acute rashes          Basic Metabolic Panel:   Recent Labs Lab 09/11/15 0937 09/11/15 1611 09/12/15 0346 09/13/15 0807 09/14/15 0557  NA 132* 133* 137 138 142  K 5.5* 5.2* 5.4* 5.2* 5.6*  CL 104 104 110 112* 119*  CO2 14* 16* 18* 17* 17*  GLUCOSE 100* 150* 104* 121* 117*  BUN 104* 97* 99* 95* 88*  CREATININE 6.90* 6.33* 5.98* 4.94* 4.06*  CALCIUM 9.6 9.4 8.7* 8.8* 8.8*  MG  --   --   --   --  1.8     CBC:  Recent Labs Lab 09/11/15 0937 09/12/15 0346  WBC 10.2 9.0  HGB 11.4* 9.9*  HCT 33.1* 28.8*  MCV 94.4 94.5  PLT 167 154      Microbiology:  Recent Results (from the past 720 hour(s))  Blood culture (routine x 2)     Status: None (Preliminary result)   Collection Time: 09/11/15  9:30 AM  Result Value Ref Range Status   Specimen Description  BLOOD RIGHT ANTECUBITAL  Final   Special Requests BOTTLES DRAWN AEROBIC AND ANAEROBIC  2CC  Final   Culture NO GROWTH 2 DAYS  Final   Report Status PENDING  Incomplete  Urine culture     Status: Abnormal   Collection Time: 09/11/15 11:46 AM  Result Value Ref Range Status   Specimen Description URINE, RANDOM  Final   Special Requests NONE  Final   Culture MULTIPLE SPECIES PRESENT, SUGGEST RECOLLECTION (A)  Final   Report Status 09/12/2015 FINAL  Final  Blood culture (routine x 2)     Status: None (Preliminary result)   Collection Time: 09/11/15 12:45 PM  Result Value Ref Range Status   Specimen Description BLOOD RIGHT ANTECUBITAL  Final   Special Requests BOTTLES DRAWN AEROBIC AND ANAEROBIC  5CC  Final   Culture NO GROWTH 2 DAYS  Final   Report Status PENDING  Incomplete  MRSA PCR Screening     Status: None   Collection Time: 09/12/15  3:41 PM  Result Value Ref Range Status   MRSA by PCR NEGATIVE NEGATIVE Final    Comment:        The GeneXpert MRSA Assay (FDA approved for NASAL specimens only), is one  component of a comprehensive MRSA colonization surveillance program. It is not intended to diagnose MRSA infection nor to guide or monitor treatment for MRSA infections.   Urine culture     Status: None   Collection Time: 09/12/15  6:06 PM  Result Value Ref Range Status   Specimen Description URINE, CATHETERIZED  Final   Special Requests NONE  Final   Culture NO GROWTH Performed at [facility]   Final   Report Status 09/14/2015 FINAL  Final    Coagulation Studies: No results for input(s): LABPROT, INR in the last 72 hours.  Urinalysis: No results for input(s): COLORURINE, LABSPEC, PHURINE, GLUCOSEU, HGBUR, BILIRUBINUR, KETONESUR, PROTEINUR, UROBILINOGEN, NITRITE, LEUKOCYTESUR in the last 72 hours.  Invalid input(s): APPERANCEUR    Imaging: No results found.   Medications:   . sodium chloride 75 mL/hr at 09/14/15 1015   . heparin  5,000 Units  Subcutaneous Q8H  . metoprolol  50 mg Oral BID  . predniSONE  40 mg Oral Q breakfast  . senna-docusate  2 tablet Oral BID  . sodium bicarbonate  650 mg Oral BID  . sodium chloride flush  3 mL Intravenous Q12H   acetaminophen **OR** acetaminophen, morphine injection, ondansetron **OR** ondansetron (ZOFRAN) IV, oxyCODONE, polyethylene glycol  Assessment/ Plan:  55 y.o.native [place] male with hypertension, gout, obesity, who was admitted to [facility] on 09/11/2015 for UTI (lower urinary tract infection) [N39.0] Acute renal insufficiency [N28.9]  1. Acute renal failure on chronic kidney disease stage IV: with left sided hydronephrosis. 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  Status post left ureteral stent placement on 7/15 by Dr. Apolinar Junes.  Patient reports baseline GFR of 21. Follows with a nephrologist in [place], [place].  - Agree with IV fluids. Strict I&Os.  - Continue to monitor volume status, urine output and renal function. No acute indication for dialysis.  - Appreciate urology input.   2. Urinary Tract Infection: culture  Negative so far 3 Acute Gout: on prednisone.    LOS: 3 Katelynn Heidler 7/17/20172:51 PM

## 2015-09-15 ENCOUNTER — Inpatient Hospital Stay: Payer: BLUE CROSS/BLUE SHIELD

## 2015-09-15 LAB — BASIC METABOLIC PANEL
Anion gap: 4 — ABNORMAL LOW (ref 5–15)
BUN: 79 mg/dL — AB (ref 6–20)
CALCIUM: 8.8 mg/dL — AB (ref 8.9–10.3)
CHLORIDE: 119 mmol/L — AB (ref 101–111)
CO2: 19 mmol/L — AB (ref 22–32)
CREATININE: 3.49 mg/dL — AB (ref 0.61–1.24)
GFR calc Af Amer: 21 mL/min — ABNORMAL LOW (ref >60)
GFR calc non Af Amer: 18 mL/min — ABNORMAL LOW (ref >60)
Glucose, Bld: 90 mg/dL (ref 65–99)
Potassium: 5.5 mmol/L — ABNORMAL HIGH (ref 3.5–5.1)
SODIUM: 142 mmol/L (ref 135–145)

## 2015-09-15 MED ORDER — CEPHALEXIN 250 MG PO CAPS
250.0000 mg | ORAL_CAPSULE | Freq: Two times a day (BID) | ORAL | Status: DC
  Administered 2015-09-15 – 2015-09-17 (×5): 250 mg via ORAL
  Filled 2015-09-15 (×5): qty 1

## 2015-09-15 MED ORDER — PATIROMER SORBITEX CALCIUM 8.4 G PO PACK
8.4000 g | PACK | Freq: Every day | ORAL | Status: DC
  Administered 2015-09-15: 8.4 g via ORAL
  Filled 2015-09-15 (×3): qty 4

## 2015-09-15 MED ORDER — SODIUM POLYSTYRENE SULFONATE 15 GM/60ML PO SUSP
30.0000 g | Freq: Once | ORAL | Status: AC
  Administered 2015-09-15: 30 g via ORAL
  Filled 2015-09-15: qty 120

## 2015-09-15 NOTE — Progress Notes (Signed)
Subjective:  Overall feels better Serum creatinine has improved to 3.46 BUN has improved to 79 Patient reports scrotal swelling and pain especially on left side   Objective:  Vital signs in last 24 hours:  Temp:  [97.6 F (36.4 C)-98.2 F (36.8 C)] 97.6 F (36.4 C) (07/18 0856) Pulse Rate:  [56-73] 65 (07/18 0856) Resp:  [18-20] 18 (07/18 0856) BP: (148-168)/(71-84) 148/71 mmHg (07/18 0856) SpO2:  [98 %-100 %] 99 % (07/18 0856)  Weight change:  Filed Weights   09/11/15 0934  Weight: 145.151 kg (320 lb)    Intake/Output:    Intake/Output Summary (Last 24 hours) at 09/15/15 1234 Last data filed at 09/15/15 0900  Gross per 24 hour  Intake   1804 ml  Output    801 ml  Net   1003 ml     Physical Exam: General: No acute distress, sitting up in bed  HEENT Anicteric, moist mucous membranes  Neck supple  Pulm/lungs Normal breathing effort, clear to auscultation  CVS/Heart Regular rhythm, no rub or gallop  Abdomen:  Soft, nontender, distended, Scrotal swelling , umbilical hernia  Extremities: Trace peripheral edema, bilateral knee swelling from acute gout  Neurologic: Alert, oriented  Skin: No acute rashes          Basic Metabolic Panel:   Recent Labs Lab 09/11/15 1611 09/12/15 0346 09/13/15 0807 09/14/15 0557 09/15/15 0532  NA 133* 137 138 142 142  K 5.2* 5.4* 5.2* 5.6* 5.5*  CL 104 110 112* 119* 119*  CO2 16* 18* 17* 17* 19*  GLUCOSE 150* 104* 121* 117* 90  BUN 97* 99* 95* 88* 79*  CREATININE 6.33* 5.98* 4.94* 4.06* 3.49*  CALCIUM 9.4 8.7* 8.8* 8.8* 8.8*  MG  --   --   --  1.8  --      CBC:  Recent Labs Lab 09/11/15 0937 09/12/15 0346  WBC 10.2 9.0  HGB 11.4* 9.9*  HCT 33.1* 28.8*  MCV 94.4 94.5  PLT 167 154      Microbiology:  Recent Results (from the past 720 hour(s))  Blood culture (routine x 2)     Status: None (Preliminary result)   Collection Time: 09/11/15  9:30 AM  Result Value Ref Range Status   Specimen Description BLOOD  RIGHT ANTECUBITAL  Final   Special Requests BOTTLES DRAWN AEROBIC AND ANAEROBIC  2CC  Final   Culture NO GROWTH 4 DAYS  Final   Report Status PENDING  Incomplete  Urine culture     Status: Abnormal   Collection Time: 09/11/15 11:46 AM  Result Value Ref Range Status   Specimen Description URINE, RANDOM  Final   Special Requests NONE  Final   Culture MULTIPLE SPECIES PRESENT, SUGGEST RECOLLECTION (A)  Final   Report Status 09/12/2015 FINAL  Final  Blood culture (routine x 2)     Status: None (Preliminary result)   Collection Time: 09/11/15 12:45 PM  Result Value Ref Range Status   Specimen Description BLOOD RIGHT ANTECUBITAL  Final   Special Requests BOTTLES DRAWN AEROBIC AND ANAEROBIC  5CC  Final   Culture NO GROWTH 4 DAYS  Final   Report Status PENDING  Incomplete  MRSA PCR Screening     Status: None   Collection Time: 09/12/15  3:41 PM  Result Value Ref Range Status   MRSA by PCR NEGATIVE NEGATIVE Final    Comment:        The GeneXpert MRSA Assay (FDA approved for NASAL specimens only), is one component  of a comprehensive MRSA colonization surveillance program. It is not intended to diagnose MRSA infection nor to guide or monitor treatment for MRSA infections.   Urine culture     Status: None   Collection Time: 09/12/15  6:06 PM  Result Value Ref Range Status   Specimen Description URINE, CATHETERIZED  Final   Special Requests NONE  Final   Culture NO GROWTH [facility]   Final   Report Status 09/14/2015 FINAL  Final    Coagulation Studies: No results for input(s): LABPROT, INR in the last 72 hours.  Urinalysis: No results for input(s): COLORURINE, LABSPEC, PHURINE, GLUCOSEU, HGBUR, BILIRUBINUR, KETONESUR, PROTEINUR, UROBILINOGEN, NITRITE, LEUKOCYTESUR in the last 72 hours.  Invalid input(s): APPERANCEUR    Imaging: No results found.   Medications:     . cephALEXin  250 mg Oral Q12H  . heparin  5,000 Units Subcutaneous Q8H  .  metoprolol  50 mg Oral BID  . patiromer  8.4 g Oral Daily  . predniSONE  40 mg Oral Q breakfast  . senna-docusate  2 tablet Oral BID  . sodium bicarbonate  650 mg Oral BID  . sodium chloride flush  3 mL Intravenous Q12H  . sodium polystyrene  30 g Oral Once   acetaminophen **OR** acetaminophen, morphine injection, ondansetron **OR** ondansetron (ZOFRAN) IV, oxyCODONE, polyethylene glycol  Assessment/ Plan:  55 y.o.native [place] male with hypertension, gout, obesity, who was admitted to [facility] on 09/11/2015 for UTI (lower urinary tract infection) [N39.0] Acute renal insufficiency [N28.9]  1. Acute renal failure on chronic kidney disease stage IV: with left sided hydronephrosis. 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    Patient reports baseline GFR of 21. Follows with a nephrologist in [place], [place].  - Agree with IV fluids. Strict I&Os.  - Continue to monitor volume status, urine output and renal function. No acute indication for dialysis.  - Appreciate urology input.   2.  Left Hydronephrosis - Status post left ureteral stent placement on 7/15 by Dr. Apolinar JunesBrandon. 3  Acute Gout: on prednisone. . 4. Hyperkalemia - start veltassa - Low K Diet - avoid orange juice   LOS: 4 Clarence Dunsmore 7/18/201712:34 PM

## 2015-09-15 NOTE — Progress Notes (Signed)
[facility] - [place] at [facility]   PATIENT NAME: Frederick Lee    MRN#:  [id number]  DATE OF BIRTH:  [birth year]  SUBJECTIVE:  Hospital Day: 4 days Frederick Lee is a 55 y.o. male presenting with Weakness  Pain and swelling and scrotum. Good urine output. Ambulated in the hallway yesterday.  Had left ureteral stent placed on 09/12/2015. Afebrile.  REVIEW OF SYSTEMS:  CONSTITUTIONAL: Positive fatigue or weakness.  EYES: No blurred or double vision.  EARS, NOSE, AND THROAT: No tinnitus or ear pain.  RESPIRATORY: No cough, shortness of breath, wheezing or hemoptysis.  CARDIOVASCULAR: No chest pain, orthopnea, edema.  GASTROINTESTINAL: No nausea, vomiting, diarrhea or abdominal pain.  GENITOURINARY: No dysuria, hematuria.  ENDOCRINE: No polyuria, nocturia,  HEMATOLOGY: No anemia, easy bruising or bleeding SKIN: No rash or lesion. MUSCULOSKELETAL: Positive joint pain NEUROLOGIC: No tingling, numbness, weakness.  PSYCHIATRY: No anxiety or depression.  Scrotal pain and swelling  DRUG ALLERGIES:  No Known Allergies  VITALS:  Blood pressure 148/71, pulse 65, temperature 97.6 F (36.4 C), temperature source Oral, resp. rate 18, height  (1.981 m), weight 145.151 kg (320 lb), SpO2 99 %.  PHYSICAL EXAMINATION:  VITAL SIGNS: Filed Vitals:   09/15/15 0548 09/15/15 0856  BP: 163/84 148/71  Pulse: 61 65  Temp: 97.7 F (36.5 C) 97.6 F (36.4 C)  Resp: 20 18   GENERAL:54 y.o.male currently in no acute distress.  HEAD: Normocephalic, atraumatic.  EYES: Pupils equal, round, reactive to light. Extraocular muscles intact. No scleral icterus.  MOUTH: Moist mucosal membrane. Dentition intact. No abscess noted.  EAR, NOSE, THROAT: Clear without exudates. No external lesions.  NECK: Supple. No thyromegaly. No nodules. No JVD.  PULMONARY: Clear to ascultation, without wheeze rails or rhonci. No use of accessory muscles, Good respiratory effort. good air entry  bilaterally CHEST: Nontender to palpation.  CARDIOVASCULAR: S1 and S2. Regular rate and rhythm. No murmurs, rubs, or gallops. No edema. Pedal pulses 2+ bilaterally.  GASTROINTESTINAL: Soft, nontender, nondistended. No masses. Positive bowel sounds. No hepatosplenomegaly.  MUSCULOSKELETAL: Bilateral knees and right 4-5 MCP with erythema and edema and to touch otherwise No swelling, clubbing, or edema. Range of motion full in all extremities.  NEUROLOGIC: Cranial nerves II through XII are intact. No gross focal neurological deficits. Sensation intact. Reflexes intact.  SKIN: No ulceration, lesions, rashes, or cyanosis. Skin warm and dry. Turgor intact.  PSYCHIATRIC: Mood, affect within normal limits. The patient is awake, alert and oriented x 3. Insight, judgment intact.  Scrotal swelling and tenderness. Left greater than right. No discharge  LABORATORY PANEL:   CBC  Recent Labs Lab 09/12/15 0346  WBC 9.0  HGB 9.9*  HCT 28.8*  PLT 154   ------------------------------------------------------------------------------------------------------------------  Chemistries   Recent Labs Lab 09/11/15 0937  09/14/15 0557 09/15/15 0532  NA 132*  < > 142 142  K 5.5*  < > 5.6* 5.5*  CL 104  < > 119* 119*  CO2 14*  < > 17* 19*  GLUCOSE 100*  < > 117* 90  BUN 104*  < > 88* 79*  CREATININE 6.90*  < > 4.06* 3.49*  CALCIUM 9.6  < > 8.8* 8.8*  MG  --   --  1.8  --   AST 27  --   --   --   ALT 38  --   --   --   ALKPHOS 81  --   --   --   BILITOT 1.3*  --   --   --   < > =  values in this interval not displayed. ------------------------------------------------------------------------------------------------------------------  Cardiac Enzymes  Recent Labs Lab 09/11/15 0937  TROPONINI <0.03   ------------------------------------------------------------------------------------------------------------------  RADIOLOGY:  No results found.  EKG:  No orders found for this or any previous  visit.  ASSESSMENT AND PLAN:   Frederick Lee is a 55 y.o. male presenting with Weakness . Admitted 09/11/2015 : Day #: 4 days   * Scrotal swelling Will check ultrasound of the scrotum. Could be edema from fluid overload. Stop IV fluids Urology on board.  * Acute kidney injury on chronic kidney disease 4 - Improving slowly Due to left UPJ obstruction. Status post stent placement on 09/12/2015 Improving renal status. Creatinine improving. Continue to monitor input and output. Appreciate nephrology and urology help.  * Hyperkalemia: Continue IV fluid hydration Kayexalate dose again today. Add Veltassa.  * Acute gout flare Prednisone  * Urinary tract infection: Continue IV antibiotics - Ceftriaxone changed to keflex 10 days total per urology  * Essential hypertension  metoprolol. Restart home hydralazine today.  * Venous thromboembolism prophylactic: Heparin  All the records are reviewed and case discussed with Care Management/Social Workerr. Management plans discussed with the patient, family and they are in agreement.  CODE STATUS: full   TOTAL TIME TAKING CARE OF THIS PATIENT: 35 minutes.   Discussed with Dr. Thedore MinsSingh of nephrology.  POSSIBLE D/C IN 1-2 DAYS, DEPENDING ON CLINICAL CONDITION.   Milagros LollSudini, Geniene List R M.D on 09/15/2015 at 11:06 AM  Between 7am to 6pm - Pager - [phone]  After 6pm: House Pager: [phone]  [facility]  Office  [phone]  CC: Primary care physician; Pcp Not In System

## 2015-09-16 LAB — BASIC METABOLIC PANEL
ANION GAP: 5 (ref 5–15)
BUN: 77 mg/dL — ABNORMAL HIGH (ref 6–20)
CALCIUM: 8.7 mg/dL — AB (ref 8.9–10.3)
CO2: 20 mmol/L — AB (ref 22–32)
Chloride: 117 mmol/L — ABNORMAL HIGH (ref 101–111)
Creatinine, Ser: 3.3 mg/dL — ABNORMAL HIGH (ref 0.61–1.24)
GFR, EST AFRICAN AMERICAN: 23 mL/min — AB (ref >60)
GFR, EST NON AFRICAN AMERICAN: 20 mL/min — AB (ref >60)
Glucose, Bld: 98 mg/dL (ref 65–99)
Potassium: 5 mmol/L (ref 3.5–5.1)
Sodium: 142 mmol/L (ref 135–145)

## 2015-09-16 LAB — CULTURE, BLOOD (ROUTINE X 2)
Culture: NO GROWTH
Culture: NO GROWTH

## 2015-09-16 MED ORDER — HYDRALAZINE HCL 25 MG PO TABS
25.0000 mg | ORAL_TABLET | Freq: Three times a day (TID) | ORAL | Status: DC
Start: 2015-09-16 — End: 2015-09-17
  Administered 2015-09-16 – 2015-09-17 (×4): 25 mg via ORAL
  Filled 2015-09-16 (×4): qty 1

## 2015-09-16 NOTE — Progress Notes (Signed)
Now with left epididymitis. Confirmed on u/s. C/o left scrotal pain No f/c Otherwise doing well  Filed Vitals:   09/15/15 0856 09/15/15 1737 09/15/15 2023 09/16/15 0519  BP: 148/71 158/81 153/72 163/85  Pulse: 65 50 63 62  Temp: 97.6 F (36.4 C) 98.1 F (36.7 C) 97.8 F (36.6 C) 97.5 F (36.4 C)  TempSrc: Oral Oral Oral Oral  Resp: 18 20 20 20   Height:      Weight:      SpO2: 99% 97% 100% 97%   I/O last 3 completed shifts: In: 1811 [P.O.:600; I.V.:1211] Out: 1801 [Urine:1800; Stool:1]     NAD Soft nt nd Left testicular/epididymal tenderness with reactive hydrocele. No abscess/crepitus  1. Left UPJ s/p stent 2. Left epididymitis -continue course of abx for 10 days total -epididymitis will resolved with abx -will need local urology follow up to address stent/UPJ -no further urologic intervention during this admission  Urology will sign off. Please page on call for questions

## 2015-09-16 NOTE — Progress Notes (Signed)
[facility] - [place] at [facility]   PATIENT NAME: Frederick Lee    MRN#:  [id number]  DATE OF BIRTH:  1960/11/05  SUBJECTIVE:  Hospital Day: 5 days Frederick Lee is a 55 y.o. male presenting with Weakness  Pain and swelling of scrotum. Good urine output. No blood. Ambulated in the hallway yesterday.  Had left ureteral stent placed on 09/12/2015. Afebrile.  REVIEW OF SYSTEMS:  CONSTITUTIONAL: Positive fatigue or weakness.  EYES: No blurred or double vision.  EARS, NOSE, AND THROAT: No tinnitus or ear pain.  RESPIRATORY: No cough, shortness of breath, wheezing or hemoptysis.  CARDIOVASCULAR: No chest pain, orthopnea, edema.  GASTROINTESTINAL: No nausea, vomiting, diarrhea or abdominal pain.  GENITOURINARY: No dysuria, hematuria.  ENDOCRINE: No polyuria, nocturia,  HEMATOLOGY: No anemia, easy bruising or bleeding SKIN: No rash or lesion. MUSCULOSKELETAL: Positive joint pain NEUROLOGIC: No tingling, numbness, weakness.  PSYCHIATRY: No anxiety or depression.  Scrotal pain and swelling  DRUG ALLERGIES:  No Known Allergies  VITALS:  Blood pressure 163/85, pulse 62, temperature 97.5 F (36.4 C), temperature source Oral, resp. rate 20, height 6\' 6"  (1.981 m), weight 145.151 kg (320 lb), SpO2 97 %.  PHYSICAL EXAMINATION:  VITAL SIGNS: Filed Vitals:   09/15/15 2023 09/16/15 0519  BP: 153/72 163/85  Pulse: 63 62  Temp: 97.8 F (36.6 C) 97.5 F (36.4 C)  Resp: 20 20   GENERAL:54 y.o.male currently in no acute distress.  HEAD: Normocephalic, atraumatic.  EYES: Pupils equal, round, reactive to light. Extraocular muscles intact. No scleral icterus.  MOUTH: Moist mucosal membrane. Dentition intact. No abscess noted.  EAR, NOSE, THROAT: Clear without exudates. No external lesions.  NECK: Supple. No thyromegaly. No nodules. No JVD.  PULMONARY: Clear to ascultation, without wheeze rails or rhonci. No use of accessory muscles, Good respiratory effort. good  air entry bilaterally CHEST: Nontender to palpation.  CARDIOVASCULAR: S1 and S2. Regular rate and rhythm. No murmurs, rubs, or gallops. No edema. Pedal pulses 2+ bilaterally.  GASTROINTESTINAL: Soft, nontender, nondistended. No masses. Positive bowel sounds. No hepatosplenomegaly.  MUSCULOSKELETAL: Bilateral knees and right 4-5 MCP with erythema and edema and to touch otherwise No swelling, clubbing, or edema. Range of motion full in all extremities.  NEUROLOGIC: Cranial nerves II through XII are intact. No gross focal neurological deficits. Sensation intact. Reflexes intact.  SKIN: No ulceration, lesions, rashes, or cyanosis. Skin warm and dry. Turgor intact.  PSYCHIATRIC: Mood, affect within normal limits. The patient is awake, alert and oriented x 3. Insight, judgment intact.  Scrotal swelling and tenderness. Left greater than right. No discharge  LABORATORY PANEL:   CBC  Recent Labs Lab 09/12/15 0346  WBC 9.0  HGB 9.9*  HCT 28.8*  PLT 154   ------------------------------------------------------------------------------------------------------------------  Chemistries   Recent Labs Lab 09/11/15 0937  09/14/15 0557  09/16/15 0454  NA 132*  < > 142  < > 142  K 5.5*  < > 5.6*  < > 5.0  CL 104  < > 119*  < > 117*  CO2 14*  < > 17*  < > 20*  GLUCOSE 100*  < > 117*  < > 98  BUN 104*  < > 88*  < > 77*  CREATININE 6.90*  < > 4.06*  < > 3.30*  CALCIUM 9.6  < > 8.8*  < > 8.7*  MG  --   --  1.8  --   --   AST 27  --   --   --   --  ALT 38  --   --   --   --   ALKPHOS 81  --   --   --   --   BILITOT 1.3*  --   --   --   --   < > = values in this interval not displayed. ------------------------------------------------------------------------------------------------------------------  Cardiac Enzymes  Recent Labs Lab 09/11/15 0937  TROPONINI <0.03    ------------------------------------------------------------------------------------------------------------------  RADIOLOGY:  [place] Scrotum  09/15/2015  CLINICAL DATA:  Patient with scrotal pain and swelling for 2 days. EXAM: SCROTAL ULTRASOUND DOPPLER ULTRASOUND OF THE TESTICLES TECHNIQUE: Complete ultrasound examination of the testicles, epididymis, and other scrotal structures was performed. Color and spectral Doppler ultrasound were also utilized to evaluate blood flow to the testicles. COMPARISON:  CT abdomen 09/12/2015 FINDINGS: Right testicle Measurements: 4.2 x 2.7 x 2.6 cm. No mass or microlithiasis visualized. Left testicle Measurements: 4.2 x 2.8 x 3.0 cm. No mass or microlithiasis visualized. Right epididymis:  Normal in size and appearance. Left epididymis: Enlarged and echogenic measuring 3.1 x 2.5 x 3.3 cm. Findings are likely sequelae of chronic epididymitis or prior infection. Hydrocele:  Small left hydrocele. Varicocele:  None visualized. Pulsed Doppler interrogation of both testes demonstrates normal low resistance arterial and venous waveforms bilaterally. There is marked scrotal wall thickening. IMPRESSION: The left epididymis is enlarged and hyperechoic, potentially sequelae of chronic epididymitis or prior infection. Marked scrotal wall thickening as can be seen with cellulitis. Small left hydrocele. No sonographic evidence for torsion. Electronically Signed   By: Annia Beltrew  Davis M.D.   On: 09/15/2015 12:54   [place] Art/ven Flow Abd Pelv Doppler  09/15/2015  CLINICAL DATA:  Patient with scrotal pain and swelling for 2 days. EXAM: SCROTAL ULTRASOUND DOPPLER ULTRASOUND OF THE TESTICLES TECHNIQUE: Complete ultrasound examination of the testicles, epididymis, and other scrotal structures was performed. Color and spectral Doppler ultrasound were also utilized to evaluate blood flow to the testicles. COMPARISON:  CT abdomen 09/12/2015 FINDINGS: Right testicle Measurements: 4.2 x 2.7 x 2.6 cm. No  mass or microlithiasis visualized. Left testicle Measurements: 4.2 x 2.8 x 3.0 cm. No mass or microlithiasis visualized. Right epididymis:  Normal in size and appearance. Left epididymis: Enlarged and echogenic measuring 3.1 x 2.5 x 3.3 cm. Findings are likely sequelae of chronic epididymitis or prior infection. Hydrocele:  Small left hydrocele. Varicocele:  None visualized. Pulsed Doppler interrogation of both testes demonstrates normal low resistance arterial and venous waveforms bilaterally. There is marked scrotal wall thickening. IMPRESSION: The left epididymis is enlarged and hyperechoic, potentially sequelae of chronic epididymitis or prior infection. Marked scrotal wall thickening as can be seen with cellulitis. Small left hydrocele. No sonographic evidence for torsion. Electronically Signed   By: Annia Beltrew  Davis M.D.   On: 09/15/2015 12:54    EKG:  No orders found for this or any previous visit.  ASSESSMENT AND PLAN:   Frederick Lee is a 55 y.o. male presenting with Weakness . Admitted 09/11/2015 : Day #: 5 days   * Scrotal swelling Due to left epididymitis. Likely also has mild edema from IV fluids. IV fluids stopped. On antibiotics. Discussed with urology Dr. Apolinar JunesBrandon.  * Acute kidney injury on chronic kidney disease 4 - Improving slowly Due to left UPJ obstruction. Status post stent placement on 09/12/2015 Improving renal status. Creatinine improving. Continue to monitor input and output. Appreciate nephrology and urology help.  * Hyperkalemia: Continue IV fluid hydration - Improving Added Veltassa.  * Acute gout flare On Prednisone. Stop prednisone today.  *  Urinary tract infection: Continue IV antibiotics - Ceftriaxone changed to keflex 10 days total per urology  * Essential hypertension  metoprolol. Restart home hydralazine today.  * Venous thromboembolism prophylactic: Heparin  All the records are reviewed and case discussed with Care Management/Social  Workerr. Management plans discussed with the patient, family and they are in agreement.  CODE STATUS: full   TOTAL TIME TAKING CARE OF THIS PATIENT: 35 minutes.   Likely discharge tomorrow.   Milagros Loll R M.D on 09/16/2015 at 9:30 AM  Between 7am to 6pm - Pager - [phone]  After 6pm: House Pager: [phone]  [facility]  Office  [phone]  CC: Primary care physician; Pcp Not In System

## 2015-09-16 NOTE — Progress Notes (Signed)
Subjective:  Overall feels improved today. Still reports left scrotal pain. Seen by urologist. Recommended to continue antibiotics for epididymitis.  Serum creatinine further improved to 3.3. Good appetite.  No leg edema.    Objective:  Vital signs in last 24 hours:  Temp:  [97.5 F (36.4 C)-98.1 F (36.7 C)] 97.5 F (36.4 C) (07/19 0519) Pulse Rate:  [50-63] 62 (07/19 0519) Resp:  [20] 20 (07/19 0519) BP: (153-163)/(72-85) 163/85 mmHg (07/19 0519) SpO2:  [97 %-100 %] 97 % (07/19 0519)  Weight change:  Filed Weights   09/11/15 0934  Weight: 145.151 kg (320 lb)    Intake/Output:    Intake/Output Summary (Last 24 hours) at 09/16/15 1028 Last data filed at 09/16/15 0900  Gross per 24 hour  Intake    483 ml  Output   1801 ml  Net  -1318 ml     Physical Exam: General: No acute distress, sitting up in bed  HEENT Anicteric, moist mucous membranes  Neck supple  Pulm/lungs Normal breathing effort, clear to auscultation  CVS/Heart Regular rhythm, no rub or gallop  Abdomen:  Soft, nontender, distended, Scrotal swelling , umbilical hernia  Extremities: Trace peripheral edema  Neurologic: Alert, oriented  Skin: No acute rashes          Basic Metabolic Panel:   Recent Labs Lab 09/12/15 0346 09/13/15 0807 09/14/15 0557 09/15/15 0532 09/16/15 0454  NA 137 138 142 142 142  K 5.4* 5.2* 5.6* 5.5* 5.0  CL 110 112* 119* 119* 117*  CO2 18* 17* 17* 19* 20*  GLUCOSE 104* 121* 117* 90 98  BUN 99* 95* 88* 79* 77*  CREATININE 5.98* 4.94* 4.06* 3.49* 3.30*  CALCIUM 8.7* 8.8* 8.8* 8.8* 8.7*  MG  --   --  1.8  --   --      CBC:  Recent Labs Lab 09/11/15 0937 09/12/15 0346  WBC 10.2 9.0  HGB 11.4* 9.9*  HCT 33.1* 28.8*  MCV 94.4 94.5  PLT 167 154      Microbiology:  Recent Results (from the past 720 hour(s))  Blood culture (routine x 2)     Status: None (Preliminary result)   Collection Time: 09/11/15  9:30 AM  Result Value Ref Range Status   Specimen  Description BLOOD RIGHT ANTECUBITAL  Final   Special Requests BOTTLES DRAWN AEROBIC AND ANAEROBIC  2CC  Final   Culture NO GROWTH 4 DAYS  Final   Report Status PENDING  Incomplete  Urine culture     Status: Abnormal   Collection Time: 09/11/15 11:46 AM  Result Value Ref Range Status   Specimen Description URINE, RANDOM  Final   Special Requests NONE  Final   Culture MULTIPLE SPECIES PRESENT, SUGGEST RECOLLECTION (A)  Final   Report Status 09/12/2015 FINAL  Final  Blood culture (routine x 2)     Status: None (Preliminary result)   Collection Time: 09/11/15 12:45 PM  Result Value Ref Range Status   Specimen Description BLOOD RIGHT ANTECUBITAL  Final   Special Requests BOTTLES DRAWN AEROBIC AND ANAEROBIC  5CC  Final   Culture NO GROWTH 4 DAYS  Final   Report Status PENDING  Incomplete  MRSA PCR Screening     Status: None   Collection Time: 09/12/15  3:41 PM  Result Value Ref Range Status   MRSA by PCR NEGATIVE NEGATIVE Final    Comment:        The GeneXpert MRSA Assay (FDA approved for NASAL specimens only), is one  component of a comprehensive MRSA colonization surveillance program. It is not intended to diagnose MRSA infection nor to guide or monitor treatment for MRSA infections.   Urine culture     Status: None   Collection Time: 09/12/15  6:06 PM  Result Value Ref Range Status   Specimen Description URINE, CATHETERIZED  Final   Special Requests NONE  Final   Culture NO GROWTH Performed at [facility]   Final   Report Status 09/14/2015 FINAL  Final    Coagulation Studies: No results for input(s): LABPROT, INR in the last 72 hours.  Urinalysis: No results for input(s): COLORURINE, LABSPEC, PHURINE, GLUCOSEU, HGBUR, BILIRUBINUR, KETONESUR, PROTEINUR, UROBILINOGEN, NITRITE, LEUKOCYTESUR in the last 72 hours.  Invalid input(s): APPERANCEUR    Imaging: US Scrotum  09/15/2015  CLINICAL DATA:  Patient with scrotal pain and swelling for 2 days. EXAM: SCROTAL  ULTRASOUND DOPPLER ULTRASOUND OF THE TESTICLES TECHNIQUE: Complete ultrasound examination of the testicles, epididymis, and other scrotal structures was performed. Color and spectral Doppler ultrasound were also utilized to evaluate blood flow to the testicles. COMPARISON:  CT abdomen 09/12/2015 FINDINGS: Right testicle Measurements: 4.2 x 2.7 x 2.6 cm. No mass or microlithiasis visualized. Left testicle Measurements: 4.2 x 2.8 x 3.0 cm. No mass or microlithiasis visualized. Right epididymis:  Normal in size and appearance. Left epididymis: Enlarged and echogenic measuring 3.1 x 2.5 x 3.3 cm. Findings are likely sequelae of chronic epididymitis or prior infection. Hydrocele:  Small left hydrocele. Varicocele:  None visualized. Pulsed Doppler interrogation of both testes demonstrates normal low resistance arterial and venous waveforms bilaterally. There is marked scrotal wall thickening. IMPRESSION: The left epididymis is enlarged and hyperechoic, potentially sequelae of chronic epididymitis or prior infection. Marked scrotal wall thickening as can be seen with cellulitis. Small left hydrocele. No sonographic evidence for torsion. Electronically Signed   By: Annia Belt M.D.   On: 09/15/2015 12:54   [place] Art/ven Flow Abd Pelv Doppler  09/15/2015  CLINICAL DATA:  Patient with scrotal pain and swelling for 2 days. EXAM: SCROTAL ULTRASOUND DOPPLER ULTRASOUND OF THE TESTICLES TECHNIQUE: Complete ultrasound examination of the testicles, epididymis, and other scrotal structures was performed. Color and spectral Doppler ultrasound were also utilized to evaluate blood flow to the testicles. COMPARISON:  CT abdomen 09/12/2015 FINDINGS: Right testicle Measurements: 4.2 x 2.7 x 2.6 cm. No mass or microlithiasis visualized. Left testicle Measurements: 4.2 x 2.8 x 3.0 cm. No mass or microlithiasis visualized. Right epididymis:  Normal in size and appearance. Left epididymis: Enlarged and echogenic measuring 3.1 x 2.5 x 3.3 cm.  Findings are likely sequelae of chronic epididymitis or prior infection. Hydrocele:  Small left hydrocele. Varicocele:  None visualized. Pulsed Doppler interrogation of both testes demonstrates normal low resistance arterial and venous waveforms bilaterally. There is marked scrotal wall thickening. IMPRESSION: The left epididymis is enlarged and hyperechoic, potentially sequelae of chronic epididymitis or prior infection. Marked scrotal wall thickening as can be seen with cellulitis. Small left hydrocele. No sonographic evidence for torsion. Electronically Signed   By: Annia Belt M.D.   On: 09/15/2015 12:54     Medications:     . cephALEXin  250 mg Oral Q12H  . heparin  5,000 Units Subcutaneous Q8H  . hydrALAZINE  25 mg Oral Q8H  . metoprolol  50 mg Oral BID  . patiromer  8.4 g Oral Daily  . senna-docusate  2 tablet Oral BID  . sodium bicarbonate  650 mg Oral BID  . sodium chloride  flush  3 mL Intravenous Q12H   acetaminophen **OR** acetaminophen, morphine injection, ondansetron **OR** ondansetron (ZOFRAN) IV, oxyCODONE, polyethylene glycol  Assessment/ Plan:  55 y.o.native [place] male with hypertension, gout, obesity, who was admitted to [facility] on 09/11/2015 for UTI (lower urinary tract infection) [N39.0] Acute renal insufficiency [N28.9]  1. Acute renal failure on chronic kidney disease stage IV: with left sided hydronephrosis. 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    Patient reports baseline GFR of 21. Follows with a nephrologist in [place], [place].  - Agree with IV fluids. Strict I&Os.  - Continue to monitor volume status, urine output and renal function. No acute indication for dialysis.  - Appreciate urology input.  -Serum creatinine improved to 3.3 today.  2.  Left Hydronephrosis - Status post left ureteral stent placement on 7/15 by Dr. Apolinar Junes. 3  Acute Gout: on prednisone.  4. Hyperkalemia - continue veltassa - Low  K Diet - avoid orange juice  5. Epididymitis - Evaluated by uroolgist. Recommended antibiotics.   LOS: 5 Glema Takaki 7/19/201710:28 AM

## 2015-09-17 DIAGNOSIS — N135 Crossing vessel and stricture of ureter without hydronephrosis: Secondary | ICD-10-CM | POA: Diagnosis present

## 2015-09-17 DIAGNOSIS — N451 Epididymitis: Secondary | ICD-10-CM | POA: Diagnosis not present

## 2015-09-17 DIAGNOSIS — I10 Essential (primary) hypertension: Secondary | ICD-10-CM | POA: Diagnosis present

## 2015-09-17 LAB — BASIC METABOLIC PANEL
ANION GAP: 7 (ref 5–15)
BUN: 70 mg/dL — ABNORMAL HIGH (ref 6–20)
CALCIUM: 8.8 mg/dL — AB (ref 8.9–10.3)
CHLORIDE: 115 mmol/L — AB (ref 101–111)
CO2: 19 mmol/L — AB (ref 22–32)
Creatinine, Ser: 2.97 mg/dL — ABNORMAL HIGH (ref 0.61–1.24)
GFR calc non Af Amer: 22 mL/min — ABNORMAL LOW (ref >60)
GFR, EST AFRICAN AMERICAN: 26 mL/min — AB (ref >60)
Glucose, Bld: 95 mg/dL (ref 65–99)
POTASSIUM: 5.1 mmol/L (ref 3.5–5.1)
Sodium: 141 mmol/L (ref 135–145)

## 2015-09-17 MED ORDER — HYDROCODONE-ACETAMINOPHEN 5-325 MG PO TABS: 1.0000 | ORAL_TABLET | Freq: Four times a day (QID) | ORAL | Status: AC | PRN

## 2015-09-17 MED ORDER — PATIROMER SORBITEX CALCIUM 8.4 G PO PACK: 8.4000 g | PACK | Freq: Every day | ORAL | Status: AC

## 2015-09-17 MED ORDER — CEPHALEXIN 250 MG PO CAPS: 250.0000 mg | ORAL_CAPSULE | Freq: Two times a day (BID) | ORAL | Status: AC

## 2015-09-17 NOTE — Progress Notes (Signed)
IV was removed. Discharge instructions, follow-up appointments, and prescriptions were provided to the pt. The pt was taken downstairs via wheelchair by volunteer services.  

## 2015-09-17 NOTE — Progress Notes (Signed)
Subjective:  Overall feels improved today. Still reports left scrotal pain. Seen by urologist. Recommended to continue antibiotics for epididymitis.  Serum creatinine further improved to 2.97. Good appetite.  Trace left leg edema.    Objective:  Vital signs in last 24 hours:  Temp:  [97.5 F (36.4 C)-98 F (36.7 C)] 97.5 F (36.4 C) (07/20 0452) Pulse Rate:  [57-65] 57 (07/20 0452) Resp:  [18-19] 18 (07/20 0452) BP: (141-147)/(66-75) 142/72 mmHg (07/20 0452) SpO2:  [98 %-100 %] 100 % (07/20 0452)  Weight change:  Filed Weights   09/11/15 0934  Weight: 145.151 kg (320 lb)    Intake/Output:    Intake/Output Summary (Last 24 hours) at 09/17/15 1004 Last data filed at 09/17/15 0415  Gross per 24 hour  Intake   1623 ml  Output   3050 ml  Net  -1427 ml     Physical Exam: General: No acute distress, sitting up in bed  HEENT Anicteric, moist mucous membranes  Neck supple  Pulm/lungs Normal breathing effort, clear to auscultation  CVS/Heart Regular rhythm, no rub or gallop  Abdomen:  Soft, nontender, distended, Scrotal swelling , umbilical hernia  Extremities: Trace peripheral edema  Neurologic: Alert, oriented  Skin: No acute rashes          Basic Metabolic Panel:   Recent Labs Lab 09/13/15 0807 09/14/15 0557 09/15/15 0532 09/16/15 0454 09/17/15 0419  NA 138 142 142 142 141  K 5.2* 5.6* 5.5* 5.0 5.1  CL 112* 119* 119* 117* 115*  CO2 17* 17* 19* 20* 19*  GLUCOSE 121* 117* 90 98 95  BUN 95* 88* 79* 77* 70*  CREATININE 4.94* 4.06* 3.49* 3.30* 2.97*  CALCIUM 8.8* 8.8* 8.8* 8.7* 8.8*  MG  --  1.8  --   --   --      CBC:  Recent Labs Lab 09/11/15 0937 09/12/15 0346  WBC 10.2 9.0  HGB 11.4* 9.9*  HCT 33.1* 28.8*  MCV 94.4 94.5  PLT 167 154      Microbiology:  Recent Results (from the past 720 hour(s))  Blood culture (routine x 2)     Status: None   Collection Time: 09/11/15  9:30 AM  Result Value Ref Range Status   Specimen Description  BLOOD RIGHT ANTECUBITAL  Final   Special Requests BOTTLES DRAWN AEROBIC AND ANAEROBIC  2CC  Final   Culture NO GROWTH 5 DAYS  Final   Report Status 09/16/2015 FINAL  Final  Urine culture     Status: Abnormal   Collection Time: 09/11/15 11:46 AM  Result Value Ref Range Status   Specimen Description URINE, RANDOM  Final   Special Requests NONE  Final   Culture MULTIPLE SPECIES PRESENT, SUGGEST RECOLLECTION (A)  Final   Report Status 09/12/2015 FINAL  Final  Blood culture (routine x 2)     Status: None   Collection Time: 09/11/15 12:45 PM  Result Value Ref Range Status   Specimen Description BLOOD RIGHT ANTECUBITAL  Final   Special Requests BOTTLES DRAWN AEROBIC AND ANAEROBIC  5CC  Final   Culture NO GROWTH 5 DAYS  Final   Report Status 09/16/2015 FINAL  Final  MRSA PCR Screening     Status: None   Collection Time: 09/12/15  3:41 PM  Result Value Ref Range Status   MRSA by PCR NEGATIVE NEGATIVE Final    Comment:        The GeneXpert MRSA Assay (FDA approved for NASAL specimens only), is one component of  a comprehensive MRSA colonization surveillance program. It is not intended to diagnose MRSA infection nor to guide or monitor treatment for MRSA infections.   Urine culture     Status: None   Collection Time: 09/12/15  6:06 PM  Result Value Ref Range Status   Specimen Description URINE, CATHETERIZED  Final   Special Requests NONE  Final   Culture NO GROWTH [facility]   Final   Report Status 09/14/2015 FINAL  Final    Coagulation Studies: No results for input(s): LABPROT, INR in the last 72 hours.  Urinalysis: No results for input(s): COLORURINE, LABSPEC, PHURINE, GLUCOSEU, HGBUR, BILIRUBINUR, KETONESUR, PROTEINUR, UROBILINOGEN, NITRITE, LEUKOCYTESUR in the last 72 hours.  Invalid input(s): APPERANCEUR    Imaging: Koreas Scrotum  09/15/2015  CLINICAL DATA:  Patient with scrotal pain and swelling for 2 days. EXAM: SCROTAL ULTRASOUND DOPPLER  ULTRASOUND OF THE TESTICLES TECHNIQUE: Complete ultrasound examination of the testicles, epididymis, and other scrotal structures was performed. Color and spectral Doppler ultrasound were also utilized to evaluate blood flow to the testicles. COMPARISON:  CT abdomen 09/12/2015 FINDINGS: Right testicle Measurements: 4.2 x 2.7 x 2.6 cm. No mass or microlithiasis visualized. Left testicle Measurements: 4.2 x 2.8 x 3.0 cm. No mass or microlithiasis visualized. Right epididymis:  Normal in size and appearance. Left epididymis: Enlarged and echogenic measuring 3.1 x 2.5 x 3.3 cm. Findings are likely sequelae of chronic epididymitis or prior infection. Hydrocele:  Small left hydrocele. Varicocele:  None visualized. Pulsed Doppler interrogation of both testes demonstrates normal low resistance arterial and venous waveforms bilaterally. There is marked scrotal wall thickening. IMPRESSION: The left epididymis is enlarged and hyperechoic, potentially sequelae of chronic epididymitis or prior infection. Marked scrotal wall thickening as can be seen with cellulitis. Small left hydrocele. No sonographic evidence for torsion. Electronically Signed   By: Annia Beltrew  Davis M.D.   On: 09/15/2015 12:54   Koreas Art/ven Flow Abd Pelv Doppler  09/15/2015  CLINICAL DATA:  Patient with scrotal pain and swelling for 2 days. EXAM: SCROTAL ULTRASOUND DOPPLER ULTRASOUND OF THE TESTICLES TECHNIQUE: Complete ultrasound examination of the testicles, epididymis, and other scrotal structures was performed. Color and spectral Doppler ultrasound were also utilized to evaluate blood flow to the testicles. COMPARISON:  CT abdomen 09/12/2015 FINDINGS: Right testicle Measurements: 4.2 x 2.7 x 2.6 cm. No mass or microlithiasis visualized. Left testicle Measurements: 4.2 x 2.8 x 3.0 cm. No mass or microlithiasis visualized. Right epididymis:  Normal in size and appearance. Left epididymis: Enlarged and echogenic measuring 3.1 x 2.5 x 3.3 cm. Findings are likely  sequelae of chronic epididymitis or prior infection. Hydrocele:  Small left hydrocele. Varicocele:  None visualized. Pulsed Doppler interrogation of both testes demonstrates normal low resistance arterial and venous waveforms bilaterally. There is marked scrotal wall thickening. IMPRESSION: The left epididymis is enlarged and hyperechoic, potentially sequelae of chronic epididymitis or prior infection. Marked scrotal wall thickening as can be seen with cellulitis. Small left hydrocele. No sonographic evidence for torsion. Electronically Signed   By: Annia Beltrew  Davis M.D.   On: 09/15/2015 12:54     Medications:     . cephALEXin  250 mg Oral Q12H  . heparin  5,000 Units Subcutaneous Q8H  . hydrALAZINE  25 mg Oral Q8H  . metoprolol  50 mg Oral BID  . patiromer  8.4 g Oral Daily  . senna-docusate  2 tablet Oral BID  . sodium bicarbonate  650 mg Oral BID  . sodium chloride flush  3 mL Intravenous Q12H   acetaminophen **OR** acetaminophen, morphine injection, ondansetron **OR** ondansetron (ZOFRAN) IV, oxyCODONE, polyethylene glycol  Assessment/ Plan:  55 y.o.native Frederick Lee with hypertension, gout, obesity, who was admitted to [facility] on 09/11/2015 for UTI (lower urinary tract infection) [N39.0] Acute renal insufficiency [N28.9]  1. Acute renal failure on chronic kidney disease stage IV: with left sided hydronephrosis. 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    Patient reports baseline GFR of 21. Follows with a nephrologist in [place], [place].  - Continue to monitor volume status, urine output and renal function. No acute indication for dialysis.  - Appreciate urology input.  -Serum creatinine improved to 2.97/GFR 26 today.  2.  Left Hydronephrosis - Status post left ureteral stent placement on 7/15 by Dr. Apolinar Junes. Patient will follow up with a local urologist of his choice in [place].  3. Hyperkalemia - Low K Diet - avoid orange  juice  4. Epididymitis - Evaluated by urologist. Recommended antibiotics.   LOS: 6 Frederick Lee 7/20/201710:04 AM

## 2015-09-17 NOTE — Discharge Instructions (Signed)
Low potassium cardiac diet  Regular activity  Your blood work on day of discharge  Results for [name], Frederick Lee (MRN [id number]) as of 09/17/2015 09:06  Ref. Range 09/17/2015 04:19  Sodium Latest Ref Range: 135-145 mmol/L 141  Potassium Latest Ref Range: 3.5-5.1 mmol/L 5.1  Chloride Latest Ref Range: 101-111 mmol/L 115 (H)  CO2 Latest Ref Range: 22-32 mmol/L 19 (L)  BUN Latest Ref Range: 6-20 mg/dL 70 (H)  Creatinine Latest Ref Range: 0.61-1.24 mg/dL 5.282.97 (H)    You will need follow up with Urologist and Nephrologist in 1-2 weeks.  You will also need repeat blood work to check kidney function and potassium levels in 1 week

## 2015-09-18 NOTE — Discharge Summary (Signed)
[facility] - [place] [facility]   PATIENT NAME: Frederick Lee    MR#:  [id number]  DATE OF BIRTH:  Dec 16, 1960  DATE OF ADMISSION:  09/11/2015 ADMITTING PHYSICIAN: Wyatt Haste, MD  DATE OF DISCHARGE: 09/17/2015  2:06 PM  PRIMARY CARE PHYSICIAN: Pcp Not In System   ADMISSION DIAGNOSIS:  UTI (lower urinary tract infection) [N39.0] Acute renal insufficiency [N28.9]  DISCHARGE DIAGNOSIS:  Active Problems:   Acute renal failure (ARF) (HCC)   UTI (lower urinary tract infection)   Hyperkalemia   UPJ (ureteropelvic junction) obstruction   Acute epididymitis   Essential hypertension   SECONDARY DIAGNOSIS:   Past Medical History  Diagnosis Date  . Hypertension   . Gout   . Chronic kidney disease      ADMITTING HISTORY  Frederick Lee is a 55 y.o. male with a known history of Essential hypertension, chronic kidney disease unknown baseline possibly stage IV with GFR of 21, gout who is presenting with generalized weakness and abdominal pain. He describes 3-4 day duration of abdominal pain suprapubic in location and painful in quality 7/10 in intensity no worsening or relieving factors. He also describes dysuria, subjective fevers chills, poor oral intake. 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  HOSPITAL COURSE:   Frederick Lee is a 55 y.o. male presenting with Weakness . Admitted 09/11/2015   * Scrotal swelling Due to left epididymitis. Likely also has mild edema from IV fluids. IV fluids stopped. On antibiotics. Discussed with urology Dr. Apolinar Junes. Patient will follow up with [place] [facility] which is closer to home in 1-2 weeks.  * Acute kidney injury on chronic kidney disease 4 - Improving slowly Due to left UPJ obstruction. Status post stent placement on 09/12/2015 Improving renal status. Creatinine improving. Appreciate nephrology and urology help.  *  Hyperkalemia Added Veltassa.  * Acute gout flare On Prednisone. Stopped prednisone after 5 days.  * Urinary tract infection:- Ceftriaxone changed to keflex 10 days total per urology  * Essential hypertension metoprolol and hydralazine Lisinopril discontinued due to hyperkalemia and acute renal failure.  * Venous thromboembolism prophylactic: Heparin  Patient is stable for discharge home to follow-up with nephrology and urology in [place].Marland Kitchen  CONSULTS OBTAINED:  Treatment Team:  Wyatt Haste, MD Lamont Dowdy, MD Vanna Scotland, MD  DRUG ALLERGIES:  No Known Allergies  DISCHARGE MEDICATIONS:   Discharge Medication List as of 09/17/2015 10:14 AM    START taking these medications   Details  cephALEXin (KEFLEX) 250 MG capsule Take 1 capsule (250 mg total) by mouth every 12 (twelve) hours., Starting 09/17/2015, Until Discontinued, Print    HYDROcodone-acetaminophen (NORCO) 5-325 MG tablet Take 1 tablet by mouth every 6 (six) hours as needed for moderate pain., Starting 09/17/2015, Until Discontinued, Print    patiromer (VELTASSA) 8.4 g packet Take 1 packet (8.4 g total) by mouth daily., Starting 09/17/2015, Until Discontinued, Print      CONTINUE these medications which have NOT CHANGED   Details  docusate sodium (COLACE) 100 MG capsule Take 100 mg by mouth 2 (two) times daily., Until Discontinued, Historical Med    hydrALAZINE (APRESOLINE) 25 MG tablet Take 25 mg by mouth 3 (three) times daily., Until Discontinued, Historical Med    metoprolol (LOPRESSOR) 50 MG tablet Take 50 mg by mouth 2 (two) times daily., Until Discontinued, Historical Med      STOP taking these medications  lisinopril (PRINIVIL,ZESTRIL) 20 MG tablet         Today   VITAL SIGNS:  Blood pressure 142/72, pulse 57, temperature 97.5 F (36.4 C), temperature source Oral, resp. rate 18, height  (1.981 m), weight 145.151 kg (320 lb), SpO2 100 %.  I/O:  No intake or output data in  the 24 hours ending 09/18/15 1429  PHYSICAL EXAMINATION:  Physical Exam  GENERAL:  55 y.o.-year-old patient lying in the bed with no acute distress.  LUNGS: Normal breath sounds bilaterally, no wheezing, rales,rhonchi or crepitation. No use of accessory muscles of respiration.  CARDIOVASCULAR: S1, S2 normal. No murmurs, rubs, or gallops.  ABDOMEN: Soft, non-tender, non-distended. Bowel sounds present. No organomegaly or mass.  NEUROLOGIC: Moves all 4 extremities. PSYCHIATRIC: The patient is alert and oriented x 3.  SKIN: No obvious rash, lesion, or ulcer.  Scrotal swelling and tenderness  DATA REVIEW:   CBC  Recent Labs Lab 09/12/15 0346  WBC 9.0  HGB 9.9*  HCT 28.8*  PLT 154    Chemistries   Recent Labs Lab 09/14/15 0557  09/17/15 0419  NA 142  < > 141  K 5.6*  < > 5.1  CL 119*  < > 115*  CO2 17*  < > 19*  GLUCOSE 117*  < > 95  BUN 88*  < > 70*  CREATININE 4.06*  < > 2.97*  CALCIUM 8.8*  < > 8.8*  MG 1.8  --   --   < > = values in this interval not displayed.  Cardiac Enzymes No results for input(s): TROPONINI in the last 168 hours.  Microbiology Results  Results for orders placed or performed during the hospital encounter of 09/11/15  Blood culture (routine x 2)     Status: None   Collection Time: 09/11/15  9:30 AM  Result Value Ref Range Status   Specimen Description BLOOD RIGHT ANTECUBITAL  Final   Special Requests BOTTLES DRAWN AEROBIC AND ANAEROBIC  2CC  Final   Culture NO GROWTH 5 DAYS  Final   Report Status 09/16/2015 FINAL  Final  Urine culture     Status: Abnormal   Collection Time: 09/11/15 11:46 AM  Result Value Ref Range Status   Specimen Description URINE, RANDOM  Final   Special Requests NONE  Final   Culture MULTIPLE SPECIES PRESENT, SUGGEST RECOLLECTION (A)  Final   Report Status 09/12/2015 FINAL  Final  Blood culture (routine x 2)     Status: None   Collection Time: 09/11/15 12:45 PM  Result Value Ref Range Status   Specimen  Description BLOOD RIGHT ANTECUBITAL  Final   Special Requests BOTTLES DRAWN AEROBIC AND ANAEROBIC  5CC  Final   Culture NO GROWTH 5 DAYS  Final   Report Status 09/16/2015 FINAL  Final  MRSA PCR Screening     Status: None   Collection Time: 09/12/15  3:41 PM  Result Value Ref Range Status   MRSA by PCR NEGATIVE NEGATIVE Final    Comment:        The GeneXpert MRSA Assay (FDA approved for NASAL specimens only), is one component of a comprehensive MRSA colonization surveillance program. It is not intended to diagnose MRSA infection nor to guide or monitor treatment for MRSA infections.   Urine culture     Status: None   Collection Time: 09/12/15  6:06 PM  Result Value Ref Range Status   Specimen Description URINE, CATHETERIZED  Final   Special Requests NONE  Final  Culture NO GROWTH Performed at [facility]   Final   Report Status 09/14/2015 FINAL  Final    RADIOLOGY:  No results found.  Follow up with PCP in 1 week.  Management plans discussed with the patient, family and they are in agreement.  CODE STATUS:  Code Status History    Date Active Date Inactive Code Status Order ID Comments User Context   09/11/2015 12:31 PM 09/17/2015  5:06 PM Full Code [id number]  Wyatt Hasteavid K Hower, MD ED      TOTAL TIME TAKING CARE OF THIS PATIENT ON DAY OF DISCHARGE: more than 30 minutes.   Milagros LollSudini, Legacy Lacivita R M.D on 09/18/2015 at 2:29 PM  Between 7am to 6pm - Pager - [phone]  After 6pm go to [email or web address] - password [email or web address]  [facility]  Office  [phone]  CC: Primary care physician; Pcp Not In System  Note: This dictation was prepared with Dragon dictation along with smaller phrase technology. Any transcriptional errors that result from this process are unintentional.

## 2015-10-02 ENCOUNTER — Telehealth: Payer: Self-pay | Admitting: Urology

## 2015-10-02 NOTE — Telephone Encounter (Signed)
Received a call from a nurse at [facility] stating that this patient was on the phone and asking for help getting a referral to a urologist close to where is lived. Dr. Apolinar Junes did emergency surgery on him as he was passing thru and needed it. He lives in [place] and needs to be seen closer to his home. I took his information from her and called the patient, I spoke with his wife Jeani Hawking @ [phone] I explained that this was not our normal but I would be happy to whatever I could to try and help them get an appointment. They want to be seen at [facility]. I called and they said they would not see a patient that had been operated on and had stents placed by another doctor. I asked if Dr. Apolinar Junes could speak with one of there doctor's and they said we would have to have him paged. So I called the hospital at [phone] and asked them to page him, he never called back. The patient called me and said that he was in a lot of pain and said that he was going to go to [facility] and be treated. That is the same hospital that the on call doctor was at. I told him that was the best thing for him to do at this time so that he would get the care he needed.  I have tried several times over the last few days to reach the patient to check on him and see how he was doing and to make sure he got the care he needed, but I have not heard back from him.   Marcelino Duster

## 2018-01-29 IMAGING — CT CT ABD-PELV W/O CM
2 of 4 series · 15 of 46 positions shown, 17 images · non-contrast
Comparison: Ultrasound exam earlier today.

CLINICAL DATA: Fever and chills.

EXAM:
CT ABDOMEN AND PELVIS WITHOUT CONTRAST
TECHNIQUE: Multidetector CT imaging of the abdomen and pelvis was performed
following the standard protocol without IV contrast.

[Series 2: routine abd pel wo · axial · 0.97mm/px · z∈[-1164,-684]mm · 12 of 110 slices shown, 14 images]
[im 9/110  soft-tissue]
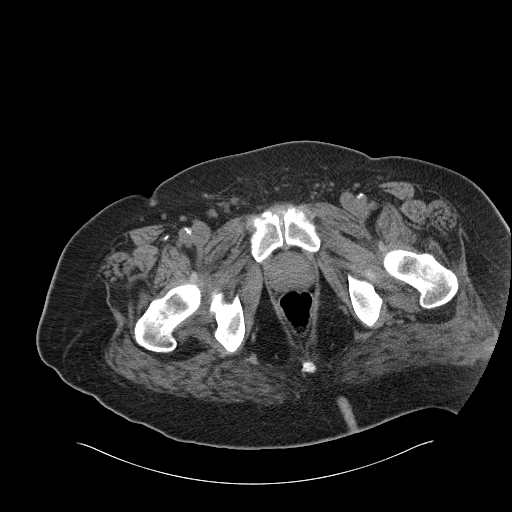
[im 9/110  bone]
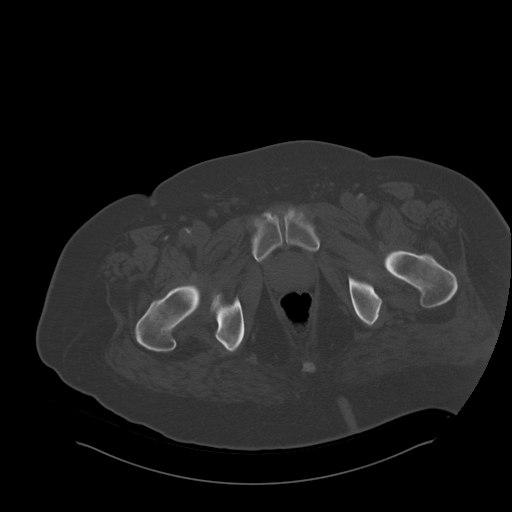
[im 18/110  soft-tissue]
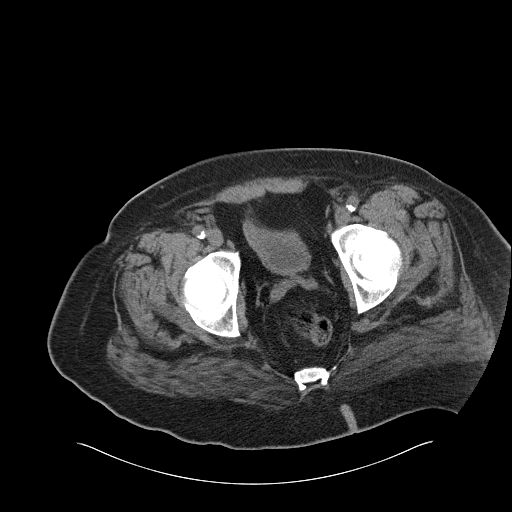
[im 27/110  soft-tissue]
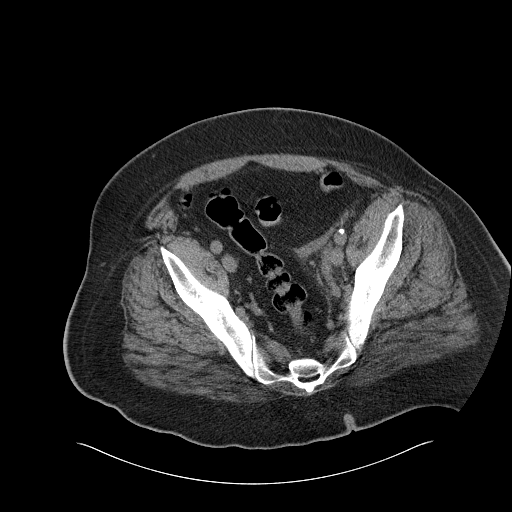
[im 35/110  soft-tissue]
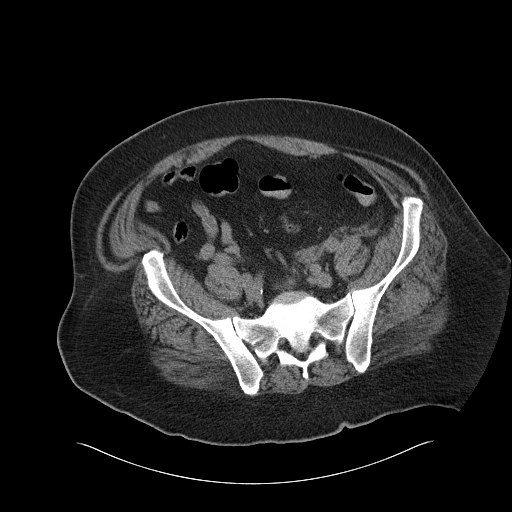
[im 44/110  soft-tissue]
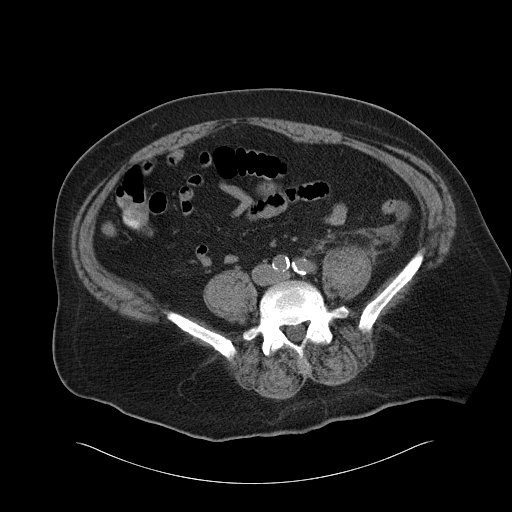
[im 53/110  soft-tissue]
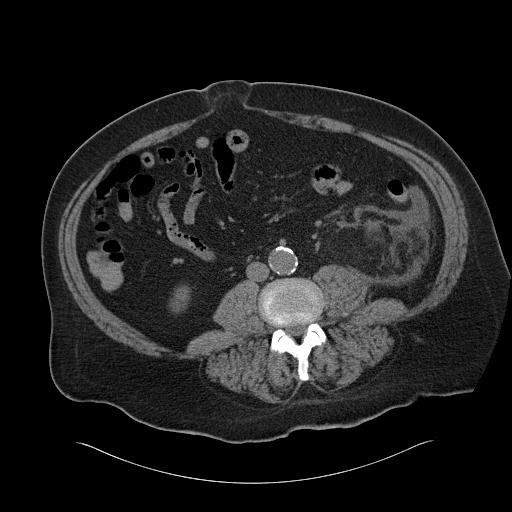
[im 62/110  soft-tissue]
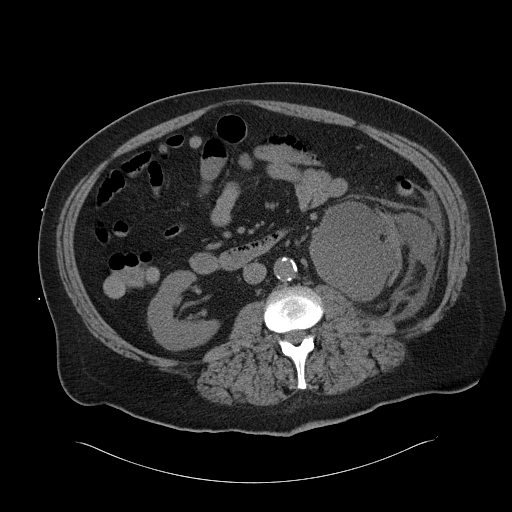
[im 70/110  soft-tissue]
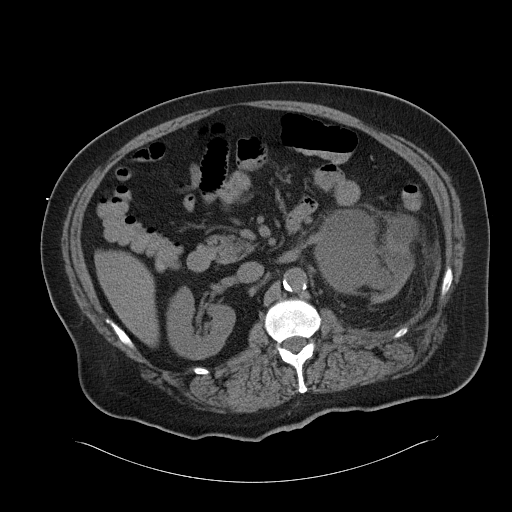
[im 79/110  soft-tissue]
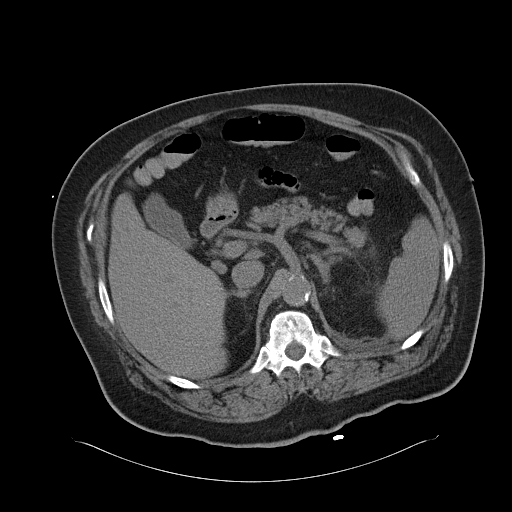
[im 79/110  bone]
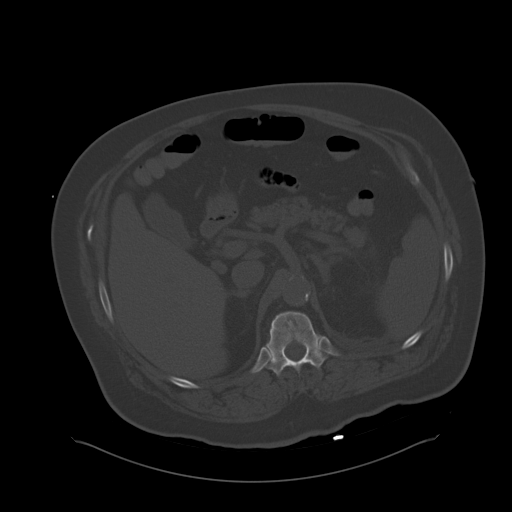
[im 88/110  soft-tissue]
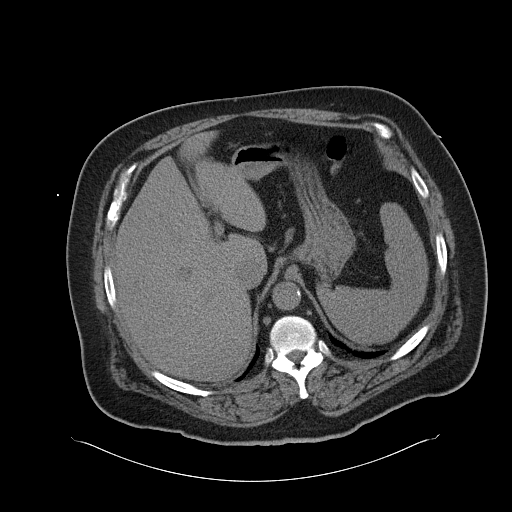
[im 96/110  soft-tissue]
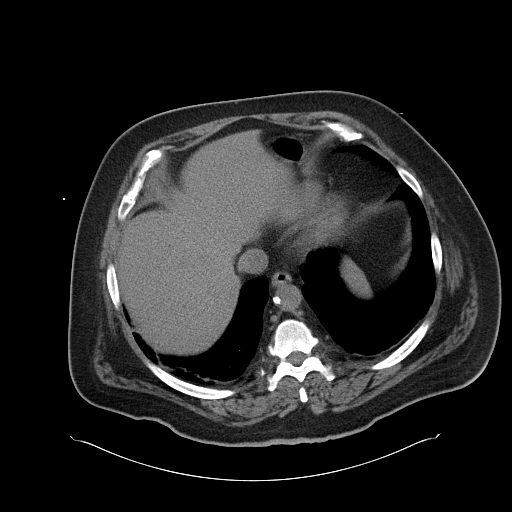
[im 105/110  soft-tissue]
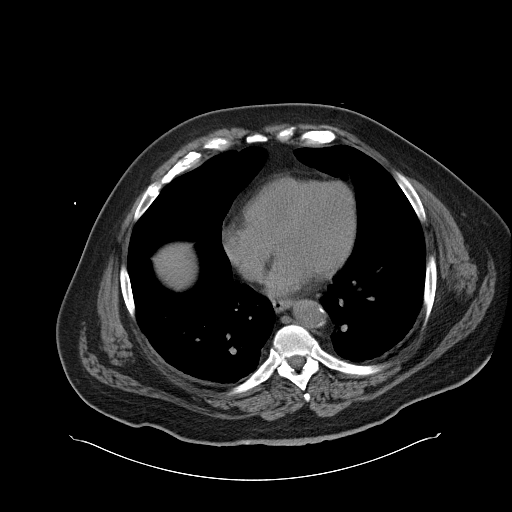

[Series 5: cor routine abd pel wo · coronal · 0.90mm/px · 3 of 171 slices shown]
[im 57/171  soft-tissue]
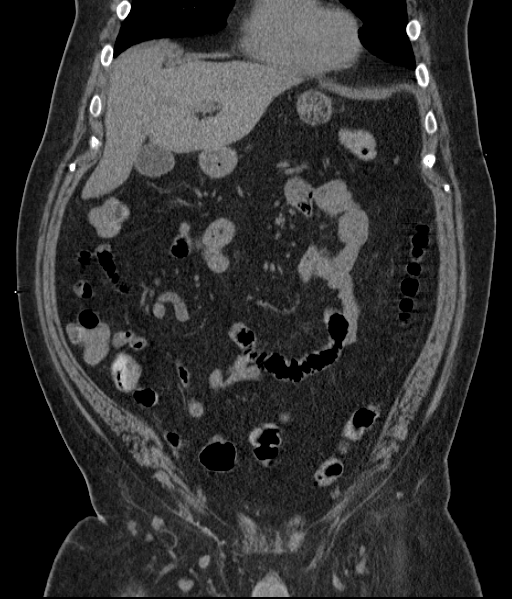
[im 76/171  soft-tissue]
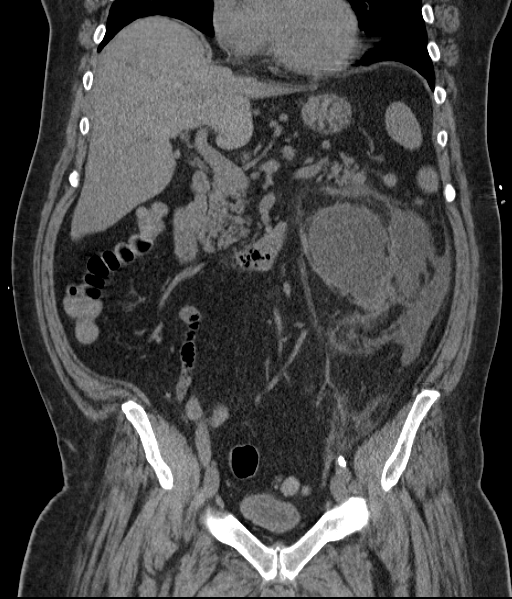
[im 95/171  soft-tissue]
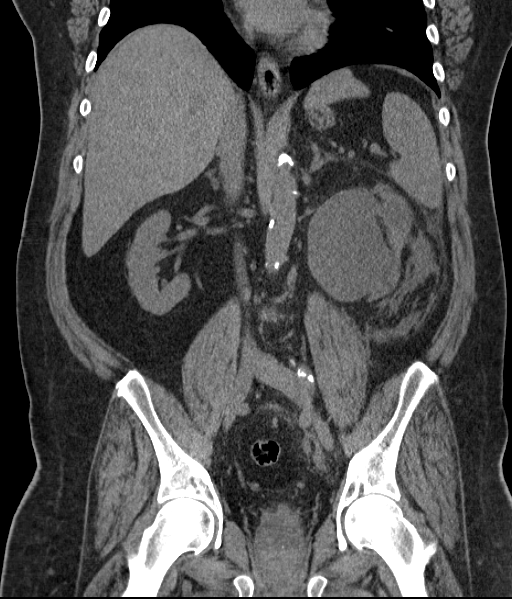

[15 of 46 positions shown; findings below may reference images not displayed]

FINDINGS: Lower chest:  Subsegmental atelectasis.

Hepatobiliary: No focal abnormality in the liver on this study
without intravenous contrast. No evidence of hepatomegaly. There is
no evidence for gallstones, gallbladder wall thickening, or
pericholecystic fluid. No intrahepatic or extrahepatic biliary
dilation.

Pancreas: No focal mass lesion. No dilatation of the main duct. No
intraparenchymal cyst. No peripancreatic edema.

Spleen: No splenomegaly. No focal mass lesion.

Adrenals/Urinary Tract: No adrenal nodule or mass. Right kidney
normal uninfused features. There is dramatic hydronephrosis of the
left kidney with overlying thinning of the cortex and a 2.3 x 5.3 cm
exophytic cystic component antral laterally. Fluid/edema is
identified in the left perinephric fat. There is no associated left
hydroureter although extraperitoneal edema does track caudally in
the left pelvic sidewall down towards the pelvis. Right ureter is
normal. The urinary bladder appears normal for the degree of
distention.

Stomach/Bowel: Stomach is nondistended. No gastric wall thickening.
No evidence of outlet obstruction. Duodenum is normally positioned
as is the ligament of Treitz. No small bowel wall thickening. No
small bowel dilatation. The terminal ileum is normal. The appendix
is normal. Diverticular changes are noted in the left colon without
evidence of diverticulitis.

Vascular/Lymphatic: There is abdominal aortic atherosclerosis
without aneurysm. There is no gastrohepatic or hepatoduodenal
ligament lymphadenopathy. No intraperitoneal or retroperitoneal
lymphadenopathy. No pelvic sidewall lymphadenopathy.

Reproductive: The prostate gland and seminal vesicles have normal
imaging features.

Other: No intraperitoneal free fluid.

Musculoskeletal: Bone windows reveal no worrisome lytic or sclerotic
osseous lesions. Umbilical hernia contains only fat.
IMPRESSION: 1. Marked left-sided hydronephrosis with associated perinephric
edema/inflammation tracking in the extraperitoneal soft tissues down
towards the pelvis. Imaging features may be related to caliceal
rupture due to obstruction at the level of the UPJ. Superinfection
of a hydronephrotic left kidney would also be a consideration. No
evidence for left urinary stone disease. In the absence of a
congenital UPJ obstruction, urothelial neoplasm must be considered.
2. Umbilical hernia contains only fat.
